# Patient Record
Sex: Male | Born: 1985 | Race: White | Hispanic: No | Marital: Married | Smoking: Current some day smoker
Health system: Southern US, Community
[De-identification: ages and names within clinical notes are randomized; demographics above are authoritative.]

## PROBLEM LIST (undated history)

## (undated) DIAGNOSIS — I10 Essential (primary) hypertension: Secondary | ICD-10-CM

## (undated) NOTE — ED Provider Notes (Signed)
 Formatting of this note is different from the original. eMERGENCY dEPARTMENT eNCOUnter    CHIEF COMPLAINT   Chief Complaint  Patient presents with  ? Foreign Body in Throat    pt ate fish and chips around 930pm, pt awoke from sleep c/o something being stuck in throat. pt says he feels it when he swallows, makes him vomit, pt maintains airway and secretions   HPI   Leroy Hill is a 51 y.o. male who presents with complaints of feeling like something is in his throat.  Patient states they were drinking alcohol this afternoon until about 6:00 p.m..  For dinner he had fish and chips.  No difficulties during dinner.  He did not feel any foreign body sensation or difficulty swallowing.  He woke up around 2:30 a.m. And was nauseated.  He started sweating and vomited.  After vomiting he felt like something was stuck around his sternal notch.  He states the feeling has been constant, and causes gagging.  He has dry heaves several times.  He still feels like there is something about midway down.  He states he can't really describe the sensation, just uncomfortable  PAST MEDICAL HISTORY   History reviewed. No pertinent past medical history.  Negative for GERD  SURGICAL HISTORY   History reviewed. No pertinent surgical history.  No previous EGD  CURRENT MEDICATIONS   Current Facility-Administered Medications  Medication Dose Route Frequency Provider Last Rate Last Dose  ? dexamethasone sodium phosphate (DECADRON) injection 10 mg  10 mg Intravenous Once Ozell JONELLE Justin, DO      ? ondansetron  (ZOFRAN ) injection 4 mg  4 mg Intravenous Once Ozell JONELLE Justin, DO      ? pantoprazole (PROTONIX) injection 40 mg  40 mg Intravenous Once Ozell JONELLE Justin, DO      ? sodium chloride (NS BOLUS) 0.9 % IV bolus 1,000 mL  1,000 mL Intravenous Once Ozell JONELLE Justin, DO       No current outpatient medications on file.   ALLERGIES   No Known Allergies  FAMILY HISTORY   History reviewed. No pertinent family history.   Noncontributory  SOCIAL HISTORY   Social History   Socioeconomic History  ? Marital status: None    Spouse name: None  ? Number of children: None  ? Years of education: None  ? Highest education level: None  Occupational History  ? None  Social Needs  ? Financial resource strain: None  ? Food insecurity:    Worry: None    Inability: None  ? Transportation needs:    Medical: None    Non-medical: None  Tobacco Use  ? Smoking status: Never Smoker  ? Smokeless tobacco: Current User    Types: Chew  Substance and Sexual Activity  ? Alcohol use: None  ? Drug use: None  ? Sexual activity: None  Lifestyle  ? Physical activity:    Days per week: None    Minutes per session: None  ? Stress: None  Relationships  ? Social connections:    Talks on phone: None    Gets together: None    Attends religious service: None    Active member of club or organization: None    Attends meetings of clubs or organizations: None    Relationship status: None  ? Intimate partner violence:    Fear of current or ex partner: None    Emotionally abused: None    Physically abused: None    Forced sexual activity: None  Other  Topics Concern  ? None  Social History Narrative  ? None   REVIEW OF SYSTEMS   Constitutional:  Denies fever, chills,  Respiratory:  Denies  shortness of breath.  Or any difficulty breathing  Cardiovascular:  Denies chest pain, GI:  Denies abdominal pain,  Neurologic:  Denies headache,   Psychiatric:  Denies depression, suicidal ideation or homicidal ideation.   See HPI for further details. ROS otherwise negative.  PHYSICAL EXAM   VITAL SIGNS: BP 176/74   Pulse 80   Temp 97.6 F (36.4 C)   Resp 18   Ht 5' 6 (1.676 m)   Wt 230 lb (104 kg)   SpO2 100%   BMI 37.12 kg/m  Constitutional:  Appears uncomfortable Eyes:  PERRLA, EOMI,  conjunctiva normal, no discharge, icterus HENT: Atraumatic, Normocephalic, Normal external nose/ears. Mucus membranes moist and  pharynx shows an edematous and elongated uvula.  No visible foreign body Neck:Normal inspection, normal range of motion, supple, no stridor Respiratory:  Normal breath sounds, no respiratory distress, no wheezing, no chest tenderness.  Cardiovascular:  Normal heart rate, normal rhythm,  Abd: Soft and nontender. Negative for distention.  Extremities:  Intact distal pulses, no edema, no tenderness, no cyanosis, no clubbing. Good range of motion in all major joints. No tenderness to palpation or major deformities noted.  Skin:  Warm, dry, no erythema, no rash.  Neurologic:  Awake and alert, gross normal cognition No focal deficits noted,  CN II-XII  Intact as tested Psychiatric: normal affect RADIOLOGY   Reviewed by me. See official radiology interpretation.  PROCEDURES   X-ray Neck Soft Tissue  Result Date: 10/18/2017 SOFT TISSUE NECK RADIOGRAPHS: COMPARISON: No comparison. FINDINGS: Anatomic alignment. Airway widely patent. Epiglottis normal. No foreign body.   IMPRESSION: NORMAL SOFT TISSUE NECK RADIOGRAPHS. Dictated By: Beverley JONETTA Specking MD 10/18/2017 6:09 AM  Electronically Signed by: Beverley JONETTA Specking MD 10/18/2017 6:14 AM X-ray Neck Soft Tissue  X-ray Neck Soft Tissue  Result Date: 10/18/2017 SOFT TISSUE NECK RADIOGRAPHS: COMPARISON: No comparison. FINDINGS: Anatomic alignment. Airway widely patent. Epiglottis normal. No foreign body.   IMPRESSION: NORMAL SOFT TISSUE NECK RADIOGRAPHS. Dictated By: Beverley JONETTA Specking MD 10/18/2017 6:09 AM  Electronically Signed by: Beverley JONETTA Specking MD 10/18/2017 6:14 AM  X-ray Chest Pa Or Ap  Result Date: 10/18/2017 SINGLE VIEW CHEST: COMPARISON: None. FINDINGS:    Mediastinum, heart size and pulmonary vascularity are normal. The lungs are clear.  No infiltrates are present. No pneumothorax or pleural effusions are present.   IMPRESSION: NORMAL CHEST. Dictated By: Mauricia JINNY Blanch, MD 10/18/2017 7:49 AM  Electronically Signed by: Mauricia JINNY Blanch, MD 10/18/2017 7:50 AM  ED  COURSE & MEDICAL DECISION MAKING   Pertinent labs & imaging studies reviewed. (See chart for details) Patient evaluated for foreign body sensation after vomiting.  He had no foreign body sensation or difficulties when he ate dinner.  Symptoms only started after waking up in vomiting.  On exam he does have some swelling of the uvula.  Patient is not sure if this is what is making him feel gagging.  He has no stridor or respiratory distress.  Neck x-ray negative for radiopaque foreign body.  Patient was able to easily drink the GI cocktail.  I have offered to the patient to have GI see him to do an endoscopy.  Patient states it is feeling better and he does not want to do that.  His significant other works for [employer].  They are supposed to have left  to go back home a few hours ago  FINAL IMPRESSION    odonyphagia Esophagitis   Ozell JONELLE Justin, DO 10/18/17 0752   Ozell JONELLE Justin, DO 10/18/17 [phone]  Electronically signed by Ozell JONELLE Justin, DO at 10/18/2017  7:54 AM EDT

---

## 2004-11-07 ENCOUNTER — Emergency Department: Payer: Self-pay | Admitting: Internal Medicine

## 2004-12-12 ENCOUNTER — Emergency Department: Payer: Self-pay | Admitting: Unknown Physician Specialty

## 2005-03-16 ENCOUNTER — Emergency Department: Payer: Self-pay | Admitting: Unknown Physician Specialty

## 2005-10-25 ENCOUNTER — Emergency Department: Payer: Self-pay | Admitting: Emergency Medicine

## 2010-10-01 DIAGNOSIS — F17200 Nicotine dependence, unspecified, uncomplicated: Secondary | ICD-10-CM | POA: Insufficient documentation

## 2010-10-01 DIAGNOSIS — I10 Essential (primary) hypertension: Secondary | ICD-10-CM | POA: Insufficient documentation

## 2012-09-13 ENCOUNTER — Emergency Department: Payer: Self-pay | Admitting: Emergency Medicine

## 2015-09-03 ENCOUNTER — Encounter: Payer: Self-pay | Admitting: Emergency Medicine

## 2015-09-03 ENCOUNTER — Ambulatory Visit
Admission: EM | Admit: 2015-09-03 | Discharge: 2015-09-03 | Disposition: A | Discharge status: Home / Self Care | Payer: 59 | Attending: Emergency Medicine | Admitting: Emergency Medicine

## 2015-09-03 ENCOUNTER — Ambulatory Visit (INDEPENDENT_AMBULATORY_CARE_PROVIDER_SITE_OTHER): Payer: 59

## 2015-09-03 DIAGNOSIS — J189 Pneumonia, unspecified organism: Secondary | ICD-10-CM

## 2015-09-03 DIAGNOSIS — J111 Influenza due to unidentified influenza virus with other respiratory manifestations: Secondary | ICD-10-CM | POA: Diagnosis not present

## 2015-09-03 HISTORY — DX: Essential (primary) hypertension: I10

## 2015-09-03 MED ORDER — PREDNISONE 50 MG PO TABS: 50.0000 mg | ORAL_TABLET | Freq: Every day | ORAL | Status: DC

## 2015-09-03 MED ORDER — IPRATROPIUM-ALBUTEROL 0.5-2.5 (3) MG/3ML IN SOLN
3.0000 mL | Freq: Once | RESPIRATORY_TRACT | Status: AC
  Administered 2015-09-03: 3 mL via RESPIRATORY_TRACT

## 2015-09-03 MED ORDER — ALBUTEROL SULFATE HFA 108 (90 BASE) MCG/ACT IN AERS: 1.0000 | INHALATION_SPRAY | Freq: Four times a day (QID) | RESPIRATORY_TRACT | Status: DC | PRN

## 2015-09-03 MED ORDER — AEROCHAMBER PLUS MISC: Status: DC
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
## 2015-09-03 NOTE — ED Notes (Signed)
Patient states he developed a cough yesterday, today he states he feels much worse, trouble breathing

## 2015-09-03 NOTE — Discharge Instructions (Signed)
Albuterol inhaler 2 puffs every 4-6 hours as needed for chest tightness, wheezing, cough. 1 g of Tylenol with 800 mg of ibuprofen 3 times a day for body aches and fever. Start the Tamiflu. Drink plenty of fluids. Go to the ER for the signs and symptoms we discussed

## 2015-11-05 ENCOUNTER — Emergency Department
Admission: EM | Admit: 2015-11-05 | Discharge: 2015-11-05 | Disposition: A | Discharge status: Home / Self Care | Payer: 59 | Attending: Emergency Medicine | Admitting: Emergency Medicine

## 2015-11-05 DIAGNOSIS — R197 Diarrhea, unspecified: Secondary | ICD-10-CM | POA: Insufficient documentation

## 2015-11-05 DIAGNOSIS — F17200 Nicotine dependence, unspecified, uncomplicated: Secondary | ICD-10-CM | POA: Diagnosis not present

## 2015-11-05 DIAGNOSIS — R1013 Epigastric pain: Secondary | ICD-10-CM | POA: Diagnosis present

## 2015-11-05 DIAGNOSIS — I10 Essential (primary) hypertension: Secondary | ICD-10-CM | POA: Diagnosis not present

## 2015-11-05 DIAGNOSIS — R112 Nausea with vomiting, unspecified: Secondary | ICD-10-CM | POA: Diagnosis not present

## 2015-11-05 MED ORDER — DICYCLOMINE HCL 20 MG PO TABS: 20.0000 mg | ORAL_TABLET | Freq: Three times a day (TID) | ORAL | Status: DC | PRN

## 2015-11-05 MED ORDER — DICYCLOMINE HCL 10 MG PO CAPS: 10.0000 mg | ORAL_CAPSULE | Freq: Once | ORAL | Status: DC

## 2015-11-05 MED ORDER — DICYCLOMINE HCL 10 MG PO CAPS: 20.0000 mg | ORAL_CAPSULE | Freq: Once | ORAL | Status: DC

## 2015-11-05 MED ORDER — DICYCLOMINE HCL 20 MG PO TABS
ORAL_TABLET | ORAL | Status: AC
  Administered 2015-11-05: 20 mg
  Filled 2015-11-05: qty 1

## 2015-11-05 MED ORDER — ONDANSETRON HCL 4 MG/2ML IJ SOLN
INTRAMUSCULAR | Status: AC
  Administered 2015-11-05: 4 mg
  Filled 2015-11-05: qty 2

## 2015-11-05 MED ORDER — ONDANSETRON HCL 4 MG PO TABS: 4.0000 mg | ORAL_TABLET | Freq: Every day | ORAL | Status: DC | PRN

## 2015-11-05 NOTE — ED Notes (Signed)
After eating at local ice cream shop today began to vomiting and have diarrhea with severe abd cramping

## 2015-11-05 NOTE — ED Provider Notes (Signed)
[facility] Emergency Department Provider Note   ____________________________________________  Time seen: Seen upon arrival to the emergency department  I have reviewed the triage vital signs and the nursing notes.   HISTORY  Chief Complaint Emesis   HPI Leroy Hill is a 30 y.o. male with a history of hypertension who is presenting to the emergency department today with nausea vomiting and diarrhea. He ate ice cream at [employer] and grandmother at about 3:30 or 4 PM this afternoon. He then said that about an hour after started having a strange feeling in his stomach. 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 He says that the vomit was very foul-smelling. Denies any blood in the vomit or diarrhea. Had an IV started en route as well as Zofran 4 mg given. Patient says he has upper abdominal cramping at this time. 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 No known sick contacts. Cramping is nonradiating and moderate at this time.   Past Medical History  Diagnosis Date  . Hypertension     There are no active problems to display for this patient.   History reviewed. No pertinent past surgical history.  Current Outpatient Rx  Name  Route  Sig  Dispense  Refill  . albuterol (PROVENTIL HFA;VENTOLIN HFA) 108 (90 Base) MCG/ACT inhaler   Inhalation   Inhale 1-2 puffs into the lungs every 6 (six) hours as needed for wheezing or shortness of breath.   1 Inhaler   0   . benazepril-hydrochlorthiazide (LOTENSIN HCT) 10-12.5 MG tablet   Oral   Take 1 tablet by mouth daily.         . predniSONE (DELTASONE) 50 MG tablet   Oral   Take 1 tablet (50 mg total) by mouth daily with breakfast.   5 tablet   0   . Spacer/Aero-Holding Chambers (AEROCHAMBER PLUS)  inhaler      Use as instructed   1 each   2     Allergies Review of patient's allergies indicates no known allergies.  No family history on file.  Social History Social History  Substance Use Topics  . Smoking status: Current Some Day Smoker  . Smokeless tobacco: Current User  . Alcohol Use: Yes    Review of Systems Constitutional: No fever/chills Eyes: No visual changes. ENT: No sore throat. Cardiovascular: Denies chest pain. Respiratory: Denies shortness of breath. Gastrointestinal: no constipation. Genitourinary: Negative for dysuria. Musculoskeletal: Negative for back pain. Skin: Negative for rash. Neurological: Negative for headaches, focal weakness or numbness.  10-point ROS otherwise negative.  ____________________________________________   PHYSICAL EXAM:  VITAL SIGNS: ED Triage Vitals  Enc Vitals Group     BP --      Pulse --      Resp --      Temp 11/05/15 2005 98.3 F (36.8 C)     Temp Source 11/05/15 2005 Oral     SpO2 --      Weight 11/05/15 1944 220 lb (99.791 kg)     Height 11/05/15 1944  (1.676 m)     Head Cir --      Peak Flow --      Pain Score 11/05/15 1944 8     Pain  Loc --      Pain Edu? --      Excl. in GC? --     Constitutional: Alert and oriented. Well appearing and in no acute distress. Eyes: Conjunctivae are normal. PERRL. EOMI. Head: Atraumatic. Nose: No congestion/rhinnorhea. Mouth/Throat: Mucous membranes are moist.   Neck: No stridor.   Cardiovascular: Normal rate, regular rhythm. Grossly normal heart sounds.   Respiratory: Normal respiratory effort.  No retractions. Lungs CTAB. Gastrointestinal: Soft With mild epigastric tenderness palpation. No rebound or guarding. No distention.  Musculoskeletal: No lower extremity tenderness nor edema.  No joint effusions. Neurologic:  Normal speech and language. No gross focal neurologic deficits are appreciated. No gait instability. Skin:  Skin is warm, dry and intact. No  rash noted. Psychiatric: Mood and affect are normal. Speech and behavior are normal.  ____________________________________________   LABS (all labs ordered are listed, but only abnormal results are displayed)  Labs Reviewed - No data to display ____________________________________________  EKG  ____________________________________________  RADIOLOGY   ____________________________________________   PROCEDURES   ____________________________________________   INITIAL IMPRESSION / ASSESSMENT AND PLAN / ED COURSE  Pertinent labs & imaging results that were available during my care of the patient were reviewed by me and considered in my medical decision making (see chart for details).  ----------------------------------------- 8:35 PM on 11/05/2015 -----------------------------------------  Patient without any distress at this time. Says that he still is having epigastric cramping but no tenderness when repalpated the epigastrium. He has had no episode of diarrhea but is been able to tolerate Bentyl by mouth. The synchronized timing of the onset of illness with his partner was for a similar symptoms but suspect that this is either food poisoning or viral illness. We discussed the lab workup but we both agree that the more reasonable course would be to rest at home and to see if the symptoms continue to resolve over time. We discussed return precautions such as worsening abdominal pain or worsening nausea or vomiting. The patient understands the plan and is willing to comply. I offered Zofran and Bentyl but he refused. Less likely to be surgical abdominal pathology at this time. ____________________________________________   FINAL CLINICAL IMPRESSION(S) / ED DIAGNOSES  Abdominal pain with nausea vomiting and diarrhea.    NEW MEDICATIONS STARTED DURING THIS VISIT:  New Prescriptions   No medications on file     Note:  This document was prepared using Dragon voice  recognition software and may include unintentional dictation errors.    Myrna Blazeravid Matthew Emmanuell Kantz, MD 11/05/15 2037

## 2015-11-05 NOTE — Discharge Instructions (Signed)
Abdominal Pain, Adult Many things can cause belly (abdominal) pain. Most times, the belly pain is not dangerous. Many cases of belly pain can be watched and treated at home. HOME CARE   Do not take medicines that help you go poop (laxatives) unless told to by your doctor.  Only take medicine as told by your doctor.  Eat or drink as told by your doctor. Your doctor will tell you if you should be on a special diet. GET HELP IF:  You do not know what is causing your belly pain.  You have belly pain while you are sick to your stomach (nauseous) or have runny poop (diarrhea).  You have pain while you pee or poop.  Your belly pain wakes you up at night.  You have belly pain that gets worse or better when you eat.  You have belly pain that gets worse when you eat fatty foods.  You have a fever. GET HELP RIGHT AWAY IF:   The pain does not go away within 2 hours.  You keep throwing up (vomiting).  The pain changes and is only in the right or left part of the belly.  You have bloody or tarry looking poop. MAKE SURE YOU:   Understand these instructions.  Will watch your condition.  Will get help right away if you are not doing well or get worse.   This information is not intended to replace advice given to you by your health care provider. Make sure you discuss any questions you have with your health care provider.   Document Released: 11/12/2007 Document Revised: 06/16/2014 Document Reviewed: 02/02/2013 Elsevier Interactive Patient Education 2016 [place].  Diarrhea Diarrhea is frequent loose and watery bowel movements. It can cause you to feel weak and dehydrated. Dehydration can cause you to become tired and thirsty, have a dry mouth, and have decreased urination that often is dark yellow. Diarrhea is a sign of another problem, most often an infection that will not last long. In most cases, diarrhea typically lasts 2-3 days. However, it can last longer if it is a sign of  something more serious. It is important to treat your diarrhea as directed by your caregiver to lessen or prevent future episodes of diarrhea. CAUSES  Some common causes include:  Gastrointestinal infections caused by viruses, bacteria, or parasites.  Food poisoning or food allergies.  Certain medicines, such as antibiotics, chemotherapy, and laxatives.  Artificial sweeteners and fructose.  Digestive disorders. HOME CARE INSTRUCTIONS  Ensure adequate fluid intake (hydration): Have 1 cup (8 oz) of fluid for each diarrhea episode. Avoid fluids that contain simple sugars or sports drinks, fruit juices, whole milk products, and sodas. Your urine should be clear or pale yellow if you are drinking enough fluids. Hydrate with an oral rehydration solution that you can purchase at pharmacies, retail stores, and online. You can prepare an oral rehydration solution at home by mixing the following ingredients together:   - tsp table salt.   tsp baking soda.   tsp salt substitute containing potassium chloride.  1  tablespoons sugar.  1 L (34 oz) of water.  Certain foods and beverages may increase the speed at which food moves through the gastrointestinal (GI) tract. These foods and beverages should be avoided and include:  Caffeinated and alcoholic beverages.  High-fiber foods, such as raw fruits and vegetables, nuts, seeds, and whole grain breads and cereals.  Foods and beverages sweetened with sugar alcohols, such as xylitol, sorbitol, and mannitol.  Some  foods may be well tolerated and may help thicken stool including:  Starchy foods, such as rice, toast, pasta, low-sugar cereal, oatmeal, grits, baked potatoes, crackers, and bagels.  Bananas.  Applesauce.  Add probiotic-rich foods to help increase healthy bacteria in the GI tract, such as yogurt and fermented milk products.  Wash your hands well after each diarrhea episode.  Only take over-the-counter or prescription medicines  as directed by your caregiver.  Take a warm bath to relieve any burning or pain from frequent diarrhea episodes. SEEK IMMEDIATE MEDICAL CARE IF:   You are unable to keep fluids down.  You have persistent vomiting.  You have blood in your stool, or your stools are black and tarry.  You do not urinate in 6-8 hours, or there is only a small amount of very dark urine.  You have abdominal pain that increases or localizes.  You have weakness, dizziness, confusion, or light-headedness.  You have a severe headache.  Your diarrhea gets worse or does not get better.  You have a fever or persistent symptoms for more than 2-3 days.  You have a fever and your symptoms suddenly get worse. MAKE SURE YOU:   Understand these instructions.  Will watch your condition.  Will get help right away if you are not doing well or get worse.   This information is not intended to replace advice given to you by your health care provider. Make sure you discuss any questions you have with your health care provider.   Document Released: 05/16/2002 Document Revised: 06/16/2014 Document Reviewed: 02/01/2012 Elsevier Interactive Patient Education 2016 Elsevier Inc[employer]  Nausea and Vomiting Nausea is a sick feeling that often comes before throwing up (vomiting). Vomiting is a reflex where stomach contents come out of your mouth. Vomiting can cause severe loss of body fluids (dehydration). Children and elderly adults can become dehydrated quickly, especially if they also have diarrhea. Nausea and vomiting are symptoms of a condition or disease. It is important to find the cause of your symptoms. CAUSES   Direct irritation of the stomach lining. This irritation can result from increased acid production (gastroesophageal reflux disease), infection, food poisoning, taking certain medicines (such as nonsteroidal anti-inflammatory drugs), alcohol use, or tobacco use.  Signals from the brain.These signals could be  caused by a headache, heat exposure, an inner ear disturbance, increased pressure in the brain from injury, infection, a tumor, or a concussion, pain, emotional stimulus, or metabolic problems.  An obstruction in the gastrointestinal tract (bowel obstruction).  Illnesses such as diabetes, hepatitis, gallbladder problems, appendicitis, kidney problems, cancer, sepsis, atypical symptoms of a heart attack, or eating disorders.  Medical treatments such as chemotherapy and radiation.  Receiving medicine that makes you sleep (general anesthetic) during surgery. DIAGNOSIS Your caregiver may ask for tests to be done if the problems do not improve after a few days. Tests may also be done if symptoms are severe or if the reason for the nausea and vomiting is not clear. Tests may include:  Urine tests.  Blood tests.  Stool tests.  Cultures (to look for evidence of infection).  X-rays or other imaging studies. Test results can help your caregiver make decisions about treatment or the need for additional tests. TREATMENT You need to stay well hydrated. Drink frequently but in small amounts.You may wish to drink water, sports drinks, clear broth, or eat frozen ice pops or gelatin dessert to help stay hydrated.When you eat, eating slowly may help prevent nausea.There are also some antinausea medicines  that may help prevent nausea. °HOME CARE INSTRUCTIONS  °· Take all medicine as directed by your caregiver. °· If you do not have an appetite, do not force yourself to eat. However, you must continue to drink fluids. °· If you have an appetite, eat a normal diet unless your caregiver tells you differently. °¨ Eat a variety of complex carbohydrates (rice, wheat, potatoes, bread), lean meats, yogurt, fruits, and vegetables. °¨ Avoid high-fat foods because they are more difficult to digest. °· Drink enough water and fluids to keep your urine clear or pale yellow. °· If you are dehydrated, ask your caregiver for  specific rehydration instructions. Signs of dehydration may include: °¨ Severe thirst. °¨ Dry lips and mouth. °¨ Dizziness. °¨ Dark urine. °¨ Decreasing urine frequency and amount. °¨ Confusion. °¨ Rapid breathing or pulse. °SEEK IMMEDIATE MEDICAL CARE IF:  °· You have blood or brown flecks (like coffee grounds) in your vomit. °· You have black or bloody stools. °· You have a severe headache or stiff neck. °· You are confused. °· You have severe abdominal pain. °· You have chest pain or trouble breathing. °· You do not urinate at least once every 8 hours. °· You develop cold or clammy skin. °· You continue to vomit for longer than 24 to 48 hours. °· You have a fever. °MAKE SURE YOU:  °· Understand these instructions. °· Will watch your condition. °· Will get help right away if you are not doing well or get worse. °  °This information is not intended to replace advice given to you by your health care provider. Make sure you discuss any questions you have with your health care provider. °  °Document Released: 05/26/2005 Document Revised: 08/18/2011 Document Reviewed: 10/23/2010 °Elsevier Interactive Patient Education ©2016 Elsevier Inc. ° °

## 2015-11-05 NOTE — ED Notes (Signed)
Sudden onset of nausea/vomiting/diarreha with abd cramping after eating at a local ice cream shop

## 2017-07-08 ENCOUNTER — Ambulatory Visit (INDEPENDENT_AMBULATORY_CARE_PROVIDER_SITE_OTHER): Payer: Commercial Managed Care - PPO

## 2017-07-08 ENCOUNTER — Ambulatory Visit
Admission: EM | Admit: 2017-07-08 | Discharge: 2017-07-08 | Disposition: A | Discharge status: Home / Self Care | Payer: Commercial Managed Care - PPO | Attending: Family Medicine | Admitting: Family Medicine

## 2017-07-08 ENCOUNTER — Encounter: Payer: Self-pay | Admitting: Emergency Medicine

## 2017-07-08 ENCOUNTER — Other Ambulatory Visit: Payer: Self-pay

## 2017-07-08 DIAGNOSIS — W19XXXA Unspecified fall, initial encounter: Secondary | ICD-10-CM

## 2017-07-08 DIAGNOSIS — M79672 Pain in left foot: Secondary | ICD-10-CM | POA: Diagnosis not present

## 2017-07-08 NOTE — ED Triage Notes (Signed)
Patient states that he fell today and developed pain on the top and bottom of his left foot.

## 2017-07-08 NOTE — ED Notes (Signed)
Walking boot placed left foot

## 2017-07-08 NOTE — Discharge Instructions (Signed)
Ice. Elevate. Rest. Drink plenty of fluids. Gradually increase weight as tolerated.   Follow up with your orthopedic or podiatry as needed for continued pain.   Follow up with your primary care physician this week as needed. Return to Urgent care for new or worsening concerns.

## 2017-07-08 NOTE — ED Provider Notes (Signed)
[facility] ____________________________________________  Time seen: Approximately 758 PM  I have reviewed the triage vital signs and the nursing notes.   HISTORY  Chief Complaint Foot Pain and Fall   HPI Leroy Hill is a 32 y.o. male presenting with wife at bedside for evaluation of left foot pain post injury that occurred approximately 2 hours prior to arrival.  Reports they were at their [employer], and his nephew went running outside down the steps and towards the road.  States that he jumped up from the table to run after him.  States that he tripped over the base of the door frame causing him to fall.  States that he fell catching himself to his hands and somehow injured his left foot, but reports quickly jumped up and went to proceed to get his nephew.  States he did not feel the pain until he returned to the table and sat down.  States he has not since been able to tolerate any weight-bear on his left foot.  Denies any pain radiation, paresthesias or other injury.  No head injury or loss conscious.  Reports other than left that he feels fine.  Denies history of same to the same area.  To take 800 mg of ibuprofen prior to arrival without change at this point.  States pain is primarily with weightbearing and some with direct pressure.  Denies other alleviating measures.  Reports otherwise feels well.Denies recent sickness. Denies recent antibiotic use.  States did stop by and borrow friend's crutches that he can continue to use as needed.  Reports follows with orthopedic at [facility].   Past Medical History:  Diagnosis Date  . Hypertension     There are no active problems to display for this patient.   History reviewed. No pertinent surgical history.   No current facility-administered medications for this encounter.  No current outpatient medications on file.  Allergies Patient has no known allergies.  History reviewed. No pertinent family  history.  Social History Social History   Tobacco Use  . Smoking status: Current Some Day Smoker  . Smokeless tobacco: Current User  Substance Use Topics  . Alcohol use: Yes  . Drug use: Not on file    Review of Systems Constitutional: No fever/chills Cardiovascular: Denies chest pain. Respiratory: Denies shortness of breath. Gastrointestinal: No abdominal pain.  Genitourinary: Negative for dysuria. Musculoskeletal: Negative for back pain.  As above Skin: Negative for rash.   ____________________________________________   PHYSICAL EXAM:  VITAL SIGNS: ED Triage Vitals  Enc Vitals Group     BP 07/08/17 1953 (!) 153/79     Pulse Rate 07/08/17 1953 74     Resp 07/08/17 1953 16     Temp 07/08/17 1953 98.3 F (36.8 C)     Temp Source 07/08/17 1953 Oral     SpO2 07/08/17 1953 99 %     Weight 07/08/17 1951 235 lb (106.6 kg)     Height 07/08/17 1951 5\' 6"  (1.676 m)     Head Circumference --      Peak Flow --      Pain Score 07/08/17 1951 2     Pain Loc --      Pain Edu? --      Excl. in GC? --     Constitutional: Alert and oriented. Well appearing and in no acute distress. Cardiovascular: Normal rate, regular rhythm. Grossly normal heart sounds.  Good peripheral circulation. Respiratory: Normal respiratory effort without tachypnea nor retractions. Breath sounds  are clear and equal bilaterally. No wheezes, rales, rhonchi. Musculoskeletal:  No midline cervical, thoracic or lumbar tenderness to palpation. Bilateral pedal pulses equal and easily palpated.   Except: Left medial foot dorsal foot at proximal base of first metatarsal mild to moderate tenderness to palpation as well as mild tenderness to same area plantar aspect, minimal swelling, no ecchymosis, normal distal sensation and capillary refill, mild pain to same area with dorsiflexion, no pain with plantarflexion, ankle and left lower extremity otherwise nontender.  Patient unable to weight-bear in room. Gait not  tested.  Neurologic:  Normal speech and language. No gross focal neurologic deficits are appreciated. Speech is normal.   Skin:  Skin is warm, dry and intact. No rash noted. Psychiatric: Mood and affect are normal. Speech and behavior are normal. Patient exhibits appropriate insight and judgment   ___________________________________________   LABS (all labs ordered are listed, but only abnormal results are displayed)  Labs Reviewed - No data to display  RADIOLOGY  Dg Foot Complete Left  Result Date: 07/08/2017 CLINICAL DATA:  Medial left foot pain dorsally after falling this evening. EXAM: LEFT FOOT - COMPLETE 3+ VIEW COMPARISON:  None. FINDINGS: There is no evidence of fracture or dislocation. There is no evidence of arthropathy or other focal bone abnormality. Soft tissues are unremarkable. IMPRESSION: Normal examination. Electronically Signed   By: Beckie SaltsSteven  Reid M.D.   On: 07/08/2017 20:35   ____________________________________________   PROCEDURES Procedures    INITIAL IMPRESSION / ASSESSMENT AND PLAN / ED COURSE  Pertinent labs & imaging results that were available during my care of the patient were reviewed by me and considered in my medical decision making (see chart for details).  Very well-appearing patient.  No acute distress.  Left foot pain post mechanical injury that occurred just prior to arrival.  Left foot x-ray per radiologist as above, normal examination.  Suspect contusion and sprain injuries.  Continue with crutches use as long as needed.  Left foot boot given to use as gradual increase of activity.  Over-the-counter ibuprofen.  Declines any prescription.  Encouraged podiatry and orthopedic follow-up for continued pain. Ice, elevation and supportive care.   Discussed follow up with Primary care physician this week. Discussed follow up and return parameters including no resolution or any worsening concerns. Patient verbalized understanding and agreed to plan.    ____________________________________________   FINAL CLINICAL IMPRESSION(S) / ED DIAGNOSES  Final diagnoses:  Left foot pain     ED Discharge Orders    None       Note: This dictation was prepared with Dragon dictation along with smaller phrase technology. Any transcriptional errors that result from this process are unintentional.         Renford DillsMiller, Casyn Becvar, NP 07/08/17 2110

## 2017-07-11 ENCOUNTER — Telehealth: Payer: Self-pay

## 2017-07-11 NOTE — Telephone Encounter (Signed)
Called to follow up with patient since visit here at [facility]. Patient instructed to call back with any questions or concerns. MAH  

## 2021-06-28 ENCOUNTER — Encounter: Payer: Self-pay | Admitting: Emergency Medicine

## 2021-06-28 ENCOUNTER — Emergency Department: Payer: Worker's Compensation

## 2021-06-28 ENCOUNTER — Other Ambulatory Visit: Payer: Self-pay

## 2021-06-28 DIAGNOSIS — T754XXA Electrocution, initial encounter: Secondary | ICD-10-CM | POA: Insufficient documentation

## 2021-06-28 DIAGNOSIS — I10 Essential (primary) hypertension: Secondary | ICD-10-CM | POA: Insufficient documentation

## 2021-06-28 DIAGNOSIS — W868XXA Exposure to other electric current, initial encounter: Secondary | ICD-10-CM | POA: Diagnosis not present

## 2021-06-28 DIAGNOSIS — Y990 Civilian activity done for income or pay: Secondary | ICD-10-CM | POA: Diagnosis not present

## 2021-06-28 LAB — BASIC METABOLIC PANEL
Anion gap: 7 (ref 5–15)
BUN: 14 mg/dL (ref 6–20)
CO2: 26 mmol/L (ref 22–32)
Calcium: 8.8 mg/dL — ABNORMAL LOW (ref 8.9–10.3)
Chloride: 104 mmol/L (ref 98–111)
Creatinine, Ser: 1 mg/dL (ref 0.61–1.24)
GFR, Estimated: >60 mL/min (ref >60)
Glucose, Bld: 100 mg/dL — ABNORMAL HIGH (ref 70–99)
Potassium: 3.9 mmol/L (ref 3.5–5.1)
Sodium: 137 mmol/L (ref 135–145)

## 2021-06-28 LAB — CBC
HCT: 38.1 % — ABNORMAL LOW (ref 39.0–52.0)
Hemoglobin: 12.9 g/dL — ABNORMAL LOW (ref 13.0–17.0)
MCH: 30.2 pg (ref 26.0–34.0)
MCHC: 33.9 g/dL (ref 30.0–36.0)
MCV: 89.2 fL (ref 80.0–100.0)
Platelets: 292 10*3/uL (ref 150–400)
RBC: 4.27 MIL/uL (ref 4.22–5.81)
RDW: 11.6 % (ref 11.5–15.5)
WBC: 5.4 10*3/uL (ref 4.0–10.5)
nRBC: 0 % (ref 0.0–0.2)

## 2021-06-28 LAB — CK: Total CK: 96 U/L (ref 49–397)
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
## 2021-06-28 NOTE — ED Notes (Signed)
Workers comp. [place].  

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
## 2021-06-29 ENCOUNTER — Emergency Department
Admission: EM | Admit: 2021-06-29 | Discharge: 2021-06-29 | Disposition: A | Discharge status: Home / Self Care | Payer: Worker's Compensation | Attending: Emergency Medicine | Admitting: Emergency Medicine

## 2021-06-29 DIAGNOSIS — T754XXA Electrocution, initial encounter: Secondary | ICD-10-CM

## 2021-06-29 LAB — CK: Total CK: 77 U/L (ref 49–397)

## 2021-06-29 LAB — TROPONIN I (HIGH SENSITIVITY): Troponin I (High Sensitivity): 3 ng/L (ref <18)
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
## 2021-07-30 ENCOUNTER — Emergency Department: Payer: Worker's Compensation

## 2021-07-30 ENCOUNTER — Emergency Department
Admission: EM | Admit: 2021-07-30 | Discharge: 2021-07-30 | Disposition: A | Discharge status: Home / Self Care | Payer: Worker's Compensation | Attending: Emergency Medicine | Admitting: Emergency Medicine

## 2021-07-30 ENCOUNTER — Other Ambulatory Visit: Payer: Self-pay

## 2021-07-30 DIAGNOSIS — W228XXA Striking against or struck by other objects, initial encounter: Secondary | ICD-10-CM | POA: Diagnosis not present

## 2021-07-30 DIAGNOSIS — N5082 Scrotal pain: Secondary | ICD-10-CM | POA: Insufficient documentation

## 2021-07-30 DIAGNOSIS — S3994XA Unspecified injury of external genitals, initial encounter: Secondary | ICD-10-CM

## 2021-07-30 NOTE — ED Provider Notes (Signed)
[facility] Provider Note    None    (approximate)   History   Testicle Pain   HPI  Leroy Hill is a 36 y.o. male who comes in with scrotal pain.  Patient states a rock shot out from under a forklift and hit his right testicle around 130 or 230.  He reports some instantaneously swelling but the swelling has now come down.  He does not really have any significant pain at this time but has severe pain when it first happened.  Went to a clinic with who felt they felt an abnormality behind his right testicle so was sent here for further evaluation.  Physical Exam   Triage Vital Signs: ED Triage Vitals  Enc Vitals Group     BP 07/30/21 1635 (!) 186/101     Pulse Rate 07/30/21 1635 65     Resp 07/30/21 1635 18     Temp 07/30/21 1635 98 F (36.7 C)     Temp src --      SpO2 07/30/21 1635 97 %     Weight --      Height --      Head Circumference --      Peak Flow --      Pain Score 07/30/21 1634 8     Pain Loc --      Pain Edu? --      Excl. in [place]? --     Most recent vital signs: Vitals:   07/30/21 1635  BP: (!) 186/101  Pulse: 65  Resp: 18  Temp: 98 F (36.7 C)  SpO2: 97%     General: Awake, no distress.  CV:  Good peripheral perfusion.  Resp:  Normal effort.  Abd:  No distention.  Other:  GU exam performed with nurse.  Patient has no swelling or ecchymosis noted to the testicles.  There are no open abrasions.  He is got a little bit of tenderness on the right testicle without any significant changes   ED Results / Procedures / Treatments   RADIOLOGY I have reviewed the  [place] no hematoma     IMPRESSION / MDM / ASSESSMENT AND PLAN / ED COURSE  I reviewed the triage vital signs and the nursing notes.                              Differential diagnosis includes, but is not limited to, scrotal rupture, scrotal abrasion.  Denies any symptoms prior to this accident to suggest issues with UTI, STDs ultrasound ordered from triage  with a cyst in the right hemiscrotum maybe that is what the other provider was feeling as well as some concern for some possible blunting of the arterial upstroke.  I did discuss this case with Dr. Bernardo Heater given this abnormal finding   Dr. Bernardo Heater reviewed it and felt that this is unlikely to represent anything emergent and can follow-up outpatient with urology as needed.  Discussed with the patient and feels comfortable with discharge home using Tylenol, ibuprofen for pain  Patient was hypertensive most likely secondary to pain and recommended a recheck with PCP     FINAL CLINICAL IMPRESSION(S) / ED DIAGNOSES   Final diagnoses:  Scrotum pain     Rx / DC Orders   ED Discharge Orders     None        Note:  This document was prepared using Dragon voice recognition software  and may include unintentional dictation errors.   Vanessa [place], MD 07/30/21 [phone]

## 2021-07-30 NOTE — ED Triage Notes (Addendum)
Pt comes with c/o scrotum pain, pt states a rock shot out and hit him in the balls while on the clock. 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 Pt states severe pain and swelling and feels like something is hanging out.

## 2021-07-30 NOTE — Discharge Instructions (Addendum)
I reviewed the ultrasound with the urologist who is not concerned about the point #1 given there was some lighting up on both testicles point number 1 on the ultrasound but u can all them to get follow up if continue to have pain or any other concerns.    IMPRESSION: 1. Possible blunting of the arterial upstroke in the right testicle, but with similar appearance of the vascularity on color Doppler in the bilateral testicles, findings which are nonspecific and may be artifactual however intermittent/partial torsion can not be entirely excluded. Recommend close interval clinical follow-up and possible repeat ultrasound with Doppler as clinically indicated. 2. Normal grayscale appearance of the bilateral testicles. 3. Anechoic 3.5 mm subcutaneous cyst in the right hemiscrotum, likely a sebaceous cyst or other benign finding.

## 2022-02-03 DIAGNOSIS — E782 Mixed hyperlipidemia: Secondary | ICD-10-CM | POA: Insufficient documentation

## 2023-06-08 IMAGING — US US SCROTUM W/ DOPPLER COMPLETE
1 series · 13 of 25 positions shown · non-contrast
Comparison: None.

CLINICAL DATA: Scrotal pain, injury to right side.

EXAM:
SCROTAL ULTRASOUND
DOPPLER ULTRASOUND OF THE TESTICLES
TECHNIQUE: Complete ultrasound examination of the testicles, epididymis, and
other scrotal structures was performed. Color and spectral Doppler
ultrasound were also utilized to evaluate blood flow to the
testicles.

[Series 1: us scrotum w/doppler · 13 of 35 slices shown]
[im 1/35]
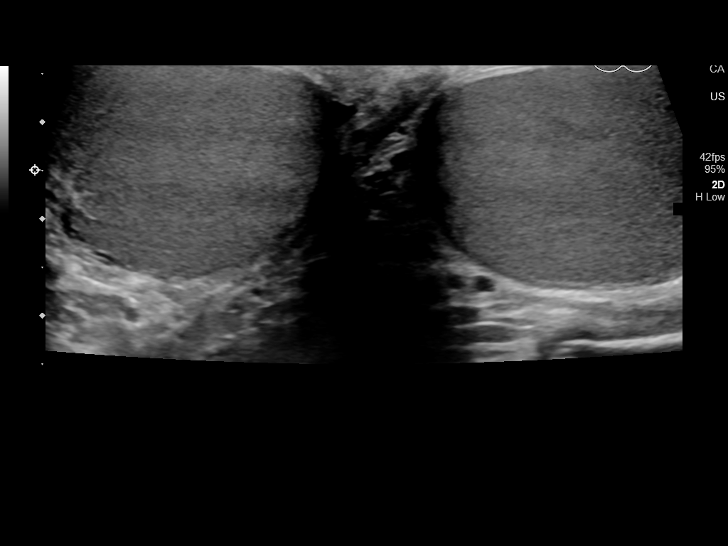
[im 3/35]
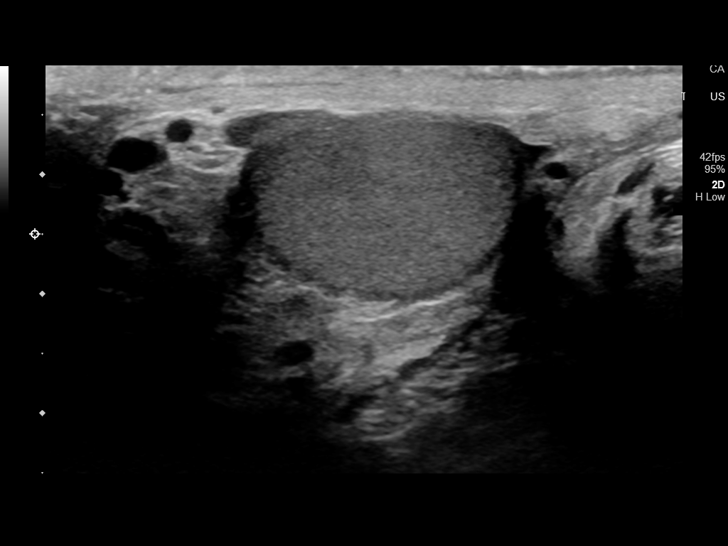
[im 6/35]
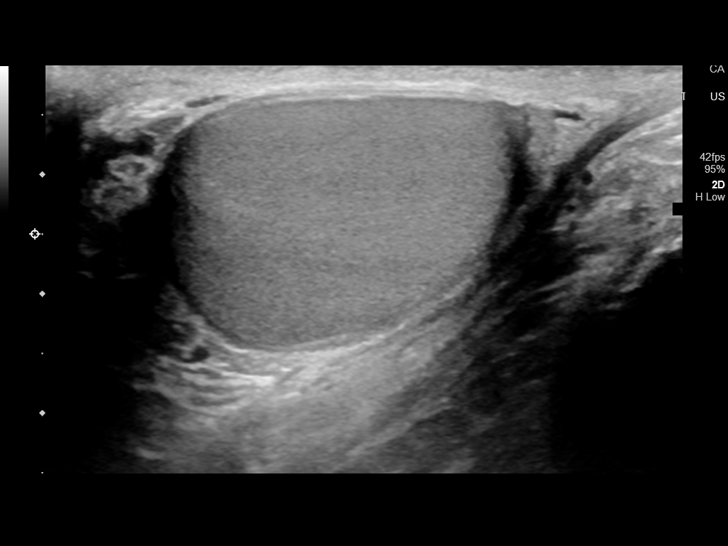
[im 9/35]
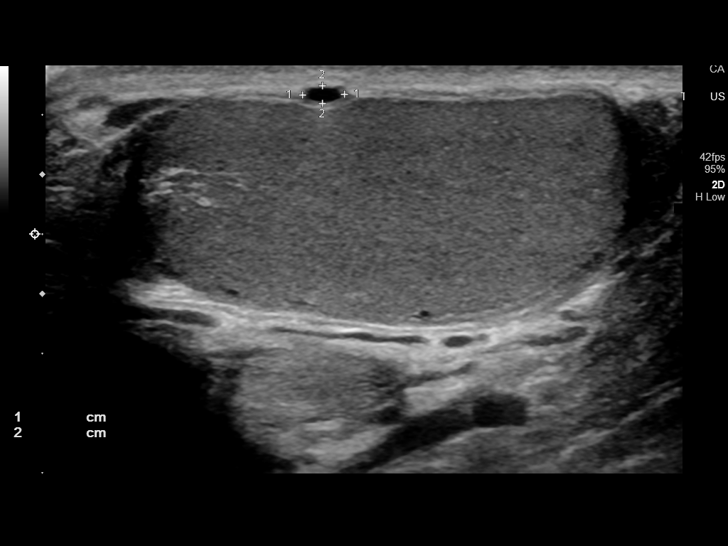
[im 12/35]
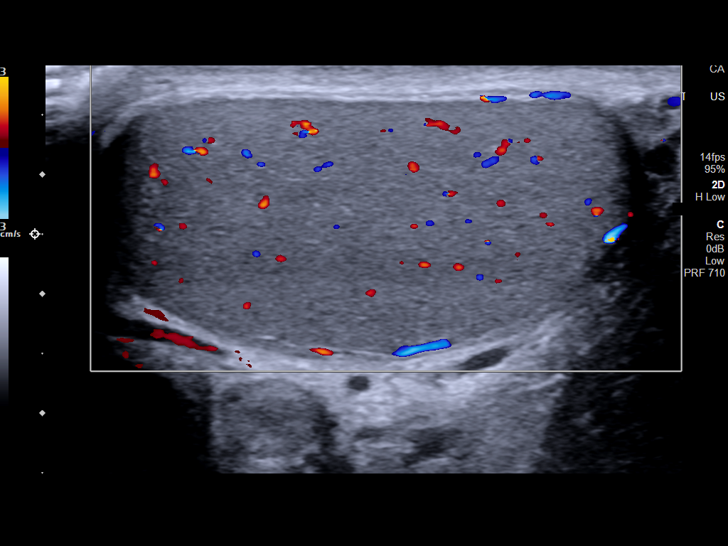
[im 15/35]
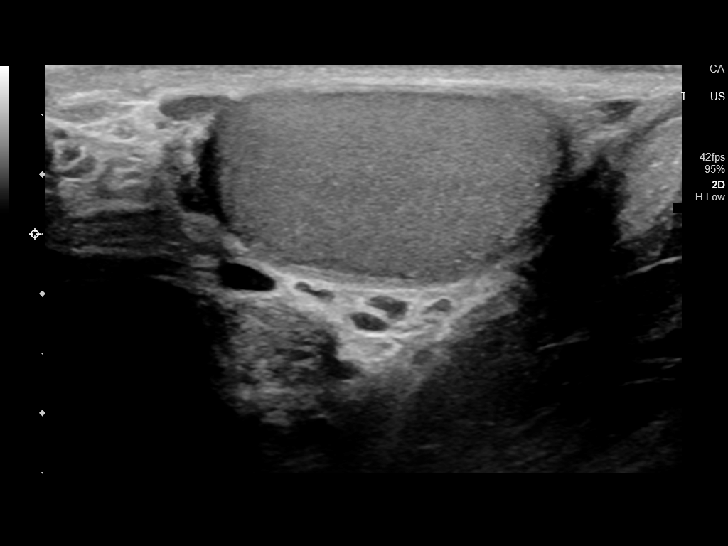
[im 18/35]
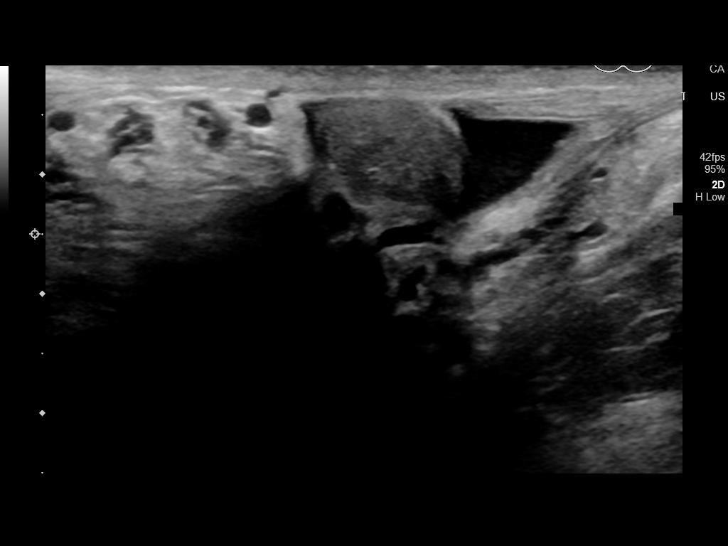
[im 20/35]
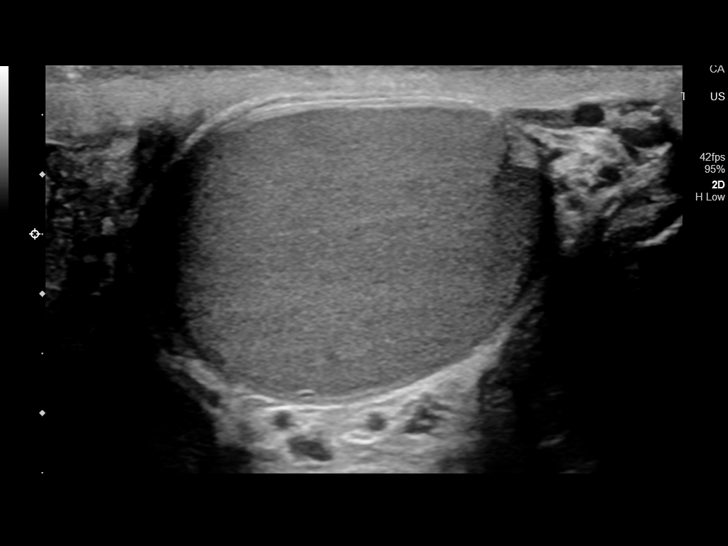
[im 23/35]
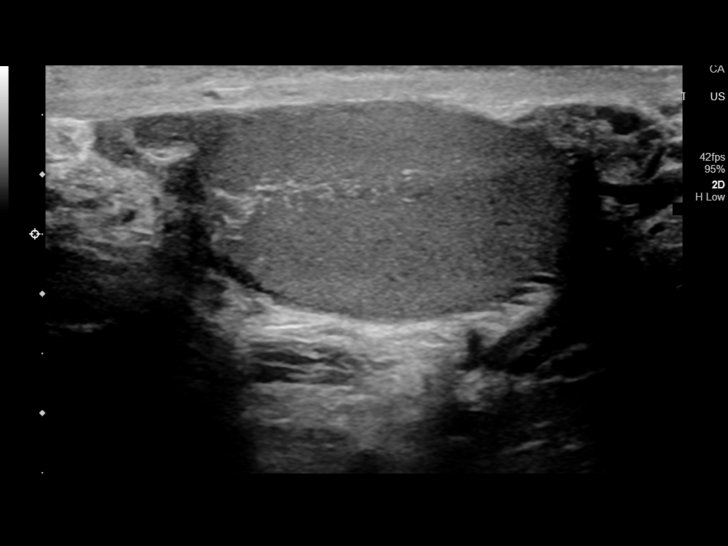
[im 26/35]
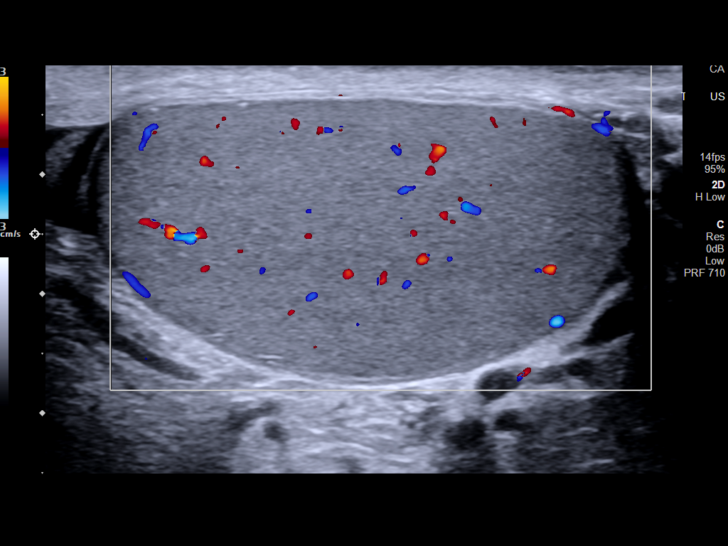
[im 29/35]
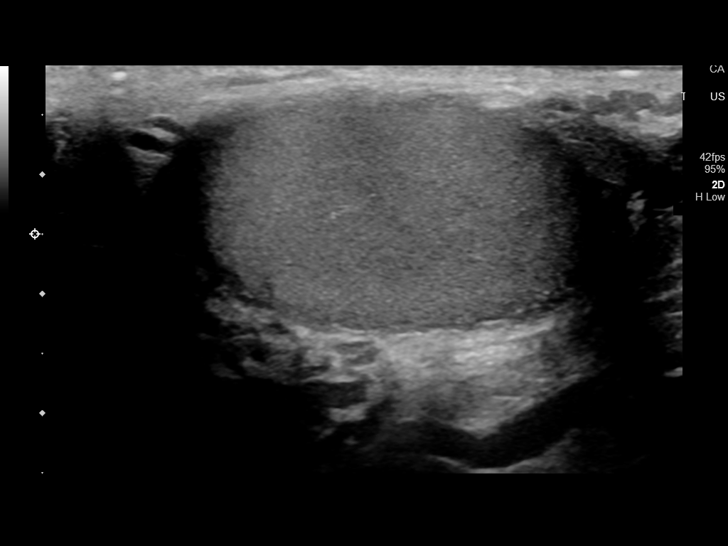
[im 32/35]
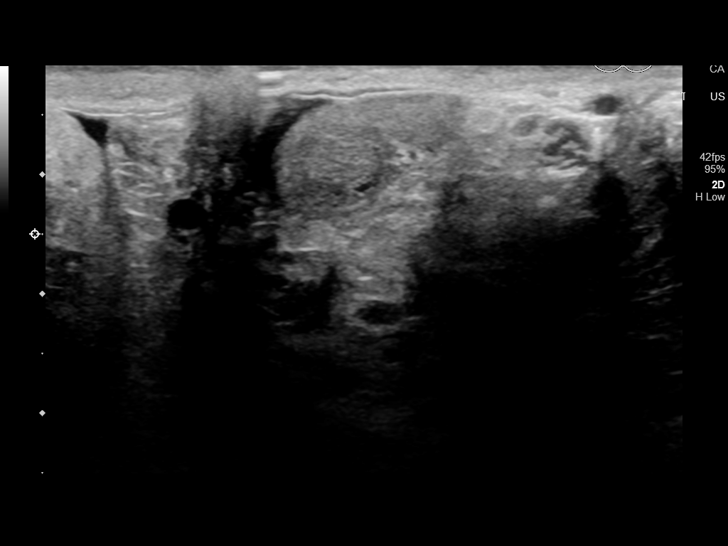
[im 35/35]
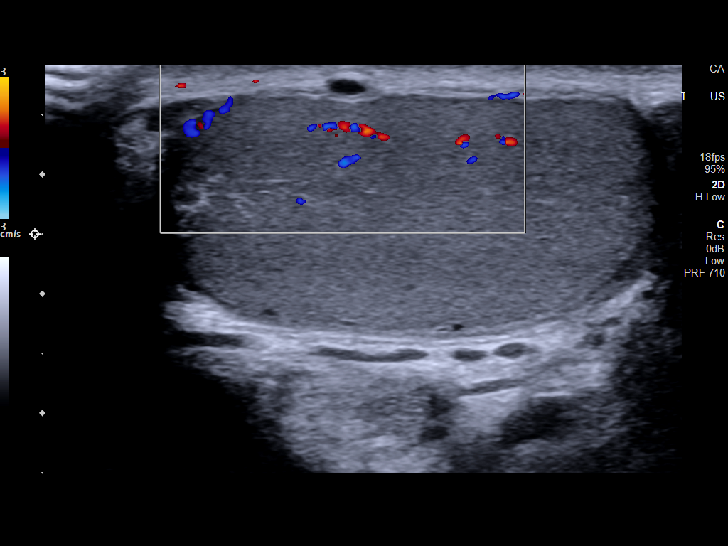

[13 of 25 positions shown; findings below may reference images not displayed]

FINDINGS: Right testicle

Measurements: 4.7 x 3.1 x 1.9 cm. No mass or microlithiasis
visualized.

Left testicle

Measurements: 4.6 x 3.1 x 2.4 cm. No mass or microlithiasis
visualized.

Right epididymis:  Normal in size and appearance.

Left epididymis:  Normal in size and appearance.

Hydrocele:  None visualized.

Varicocele:  None visualized.

Pulsed Doppler interrogation of both testes demonstrates normal low
resistance arterial and venous waveforms on the LEFT with some
possible blunting of the arterial upstroke in the RIGHT testicle.

Other: Anechoic 3.5 mm subcutaneous cyst in the right hemiscrotum.
IMPRESSION: 1. Possible blunting of the arterial upstroke in the right testicle,
but with similar appearance of the vascularity on color Doppler in
the bilateral testicles, findings which are nonspecific and may be
artifactual however intermittent/partial torsion can not be entirely
excluded. Recommend close interval clinical follow-up and possible
repeat ultrasound with Doppler as clinically indicated.
2. Normal grayscale appearance of the bilateral testicles.
3. Anechoic 3.5 mm subcutaneous cyst in the right hemiscrotum,
likely a sebaceous cyst or other benign finding.

## 2023-12-24 DIAGNOSIS — I25110 Atherosclerotic heart disease of native coronary artery with unstable angina pectoris: Secondary | ICD-10-CM | POA: Insufficient documentation

## 2024-01-07 ENCOUNTER — Encounter: Attending: Cardiology | Admitting: *Deleted

## 2024-01-07 DIAGNOSIS — Z955 Presence of coronary angioplasty implant and graft: Secondary | ICD-10-CM

## 2024-01-07 NOTE — Progress Notes (Signed)
 Initial phone call completed. Diagnosis can be found in [facility] 7/21. EP Orientation scheduled for Wednesday 8/6 at 1:45.

## 2024-01-13 ENCOUNTER — Encounter: Attending: Cardiology

## 2024-01-13 VITALS — Ht 66.8 in | Wt 221.0 lb

## 2024-01-13 DIAGNOSIS — Z955 Presence of coronary angioplasty implant and graft: Secondary | ICD-10-CM | POA: Diagnosis present

## 2024-01-13 NOTE — Progress Notes (Signed)
 Cardiac Individual Treatment Plan  Patient Details  Name: Leroy Hill MRN: [id number] Date of Birth: [birth year] Referring Provider:   Flowsheet Row Cardiac Rehab from 01/13/2024 in [facility] Cardiac and Pulmonary Rehab  Referring Provider Dr. Zachary Car    Initial Encounter Date:  Flowsheet Row Cardiac Rehab from 01/13/2024 in [facility] Cardiac and Pulmonary Rehab  Date 01/13/24    Visit Diagnosis: Status post coronary artery stent placement  Patient's Home Medications on Admission:  Current Outpatient Medications:    aspirin 81 MG chewable tablet, Chew 81 mg by mouth., Disp: , Rfl:    atorvastatin (LIPITOR) 80 MG tablet, Take 80 mg by mouth daily., Disp: , Rfl:    EPINEPHrine 0.3 mg/0.3 mL IJ SOAJ injection, Inject into the muscle., Disp: , Rfl:    ezetimibe (ZETIA) 10 MG tablet, Take 10 mg by mouth daily., Disp: , Rfl:    losartan (COZAAR) 50 MG tablet, Take 50 mg by mouth., Disp: , Rfl:    nitroGLYCERIN (NITROSTAT) 0.4 MG SL tablet, Place 0.4 mg under the tongue., Disp: , Rfl:    ticagrelor (BRILINTA) 90 MG TABS tablet, Take 90 mg by mouth., Disp: , Rfl:   Past Medical History: Past Medical History:  Diagnosis Date   Hypertension     Tobacco Use: Social History   Tobacco Use  Smoking Status Some Days  Smokeless Tobacco Current    Labs: Review Flowsheet        No data to display           Exercise Target Goals: Exercise Program Goal: Individual exercise prescription set using results from initial 6 min walk test and THRR while considering  patient's activity barriers and safety.   Exercise Prescription Goal: Initial exercise prescription builds to 30-45 minutes a day of aerobic activity, 2-3 days per week.  Home exercise guidelines will be given to patient during program as part of exercise prescription that the participant will acknowledge.   Education: Aerobic Exercise: - Group verbal and visual presentation on the components of exercise prescription.  Introduces F.I.T.T principle from ACSM for exercise prescriptions.  Reviews F.I.T.T. principles of aerobic exercise including progression. Written material given at graduation.   Education: Resistance Exercise: - Group verbal and visual presentation on the components of exercise prescription. Introduces F.I.T.T principle from ACSM for exercise prescriptions  Reviews F.I.T.T. principles of resistance exercise including progression. Written material given at graduation.    Education: Exercise & Equipment Safety: - Individual verbal instruction and demonstration of equipment use and safety with use of the equipment. Flowsheet Row Cardiac Rehab from 01/13/2024 in [facility] Cardiac and Pulmonary Rehab  Date 01/13/24  Educator [facility]  Instruction Review Code 1- Verbalizes Understanding    Education: Exercise Physiology & General Exercise Guidelines: - Group verbal and written instruction with models to review the exercise physiology of the cardiovascular system and associated critical values. Provides general exercise guidelines with specific guidelines to those with heart or lung disease.    Education: Flexibility, Balance, Mind/Body Relaxation: - Group verbal and visual presentation with interactive activity on the components of exercise prescription. Introduces F.I.T.T principle from ACSM for exercise prescriptions. Reviews F.I.T.T. principles of flexibility and balance exercise training including progression. Also discusses the mind body connection.  Reviews various relaxation techniques to help reduce and manage stress (i.e. Deep breathing, progressive muscle relaxation, and visualization). Balance handout provided to take home. Written material given at graduation.   Activity Barriers & Risk Stratification:  Activity Barriers & Cardiac Risk Stratification - 01/13/24  1522       Activity Barriers & Cardiac Risk Stratification   Activity Barriers None    Cardiac Risk Stratification Moderate           6 Minute Walk:  6 Minute Walk     Row Name 01/13/24 1519         6 Minute Walk   Phase Initial     Distance 1570 feet     Walk Time 6 minutes     # of Rest Breaks 0     MPH 2.97     METS 4.77     RPE 7     Perceived Dyspnea  0     VO2 Peak 16.7     Symptoms No     Resting HR 69 bpm     Resting BP 116/64     Resting Oxygen Saturation  98 %     Exercise Oxygen Saturation  during 6 min walk 98 %     Max Ex. HR 106 bpm     Max Ex. BP 134/66     2 Minute Post BP 126/68        Oxygen Initial Assessment:   Oxygen Re-Evaluation:   Oxygen Discharge (Final Oxygen Re-Evaluation):   Initial Exercise Prescription:  Initial Exercise Prescription - 01/13/24 1500       Date of Initial Exercise RX and Referring Provider   Date 01/13/24    Referring Provider Dr. Zachary Car      Oxygen   Maintain Oxygen Saturation 88% or higher      Treadmill   MPH 3    Grade 1    Minutes 15    METs 3.71      NuStep   Level 3    SPM 80    Minutes 15    METs 4.77      Elliptical   Level 1    Speed 3    Minutes 15    METs 4.77      REL-XR   Level 3    Watts 25    Speed 50    Minutes 15    METs 4.77      Rower   Level 5    Watts 25    Minutes 15    METs 4.77      Prescription Details   Duration Progress to 30 minutes of continuous aerobic without signs/symptoms of physical distress      Intensity   THRR 40-80% of Max Heartrate 114-159    Ratings of Perceived Exertion 11-13    Perceived Dyspnea 0-4      Progression   Progression Continue to progress workloads to maintain intensity without signs/symptoms of physical distress.      Resistance Training   Training Prescription Yes    Weight 10lb    Reps 10-15          Perform Capillary Blood Glucose checks as needed.  Exercise Prescription Changes:   Exercise Prescription Changes     Row Name 01/13/24 1500             Response to Exercise   Blood Pressure (Admit) 116/64       Blood  Pressure (Exercise) 134/66       Blood Pressure (Exit) 126/68       Heart Rate (Admit) 69 bpm       Heart Rate (Exercise) 106 bpm       Heart Rate (Exit) 77 bpm  Oxygen Saturation (Admit) 98 %       Oxygen Saturation (Exercise) 98 %       Oxygen Saturation (Exit) 98 %       Rating of Perceived Exertion (Exercise) 7       Perceived Dyspnea (Exercise) 0       Symptoms none       Comments results          Exercise Comments:   Exercise Goals and Review:   Exercise Goals     Row Name 01/13/24 1525             Exercise Goals   Increase Physical Activity Yes       Intervention Provide advice, education, support and counseling about physical activity/exercise needs.;Develop an individualized exercise prescription for aerobic and resistive training based on initial evaluation findings, risk stratification, comorbidities and participant's personal goals.       Expected Outcomes Short Term: Attend rehab on a regular basis to increase amount of physical activity.;Long Term: Add in home exercise to make exercise part of routine and to increase amount of physical activity.;Long Term: Exercising regularly at least 3-5 days a week.       Increase Strength and Stamina Yes       Intervention Provide advice, education, support and counseling about physical activity/exercise needs.;Develop an individualized exercise prescription for aerobic and resistive training based on initial evaluation findings, risk stratification, comorbidities and participant's personal goals.       Expected Outcomes Short Term: Increase workloads from initial exercise prescription for resistance, speed, and METs.;Short Term: Perform resistance training exercises routinely during rehab and add in resistance training at home;Long Term: Improve cardiorespiratory fitness, muscular endurance and strength as measured by increased METs and functional capacity ( )       Able to understand and use rate of perceived  exertion (RPE) scale Yes       Intervention Provide education and explanation on how to use RPE scale       Expected Outcomes Short Term: Able to use RPE daily in rehab to express subjective intensity level;Long Term:  Able to use RPE to guide intensity level when exercising independently       Able to understand and use Dyspnea scale Yes       Intervention Provide education and explanation on how to use Dyspnea scale       Expected Outcomes Short Term: Able to use Dyspnea scale daily in rehab to express subjective sense of shortness of breath during exertion;Long Term: Able to use Dyspnea scale to guide intensity level when exercising independently       Knowledge and understanding of Target Heart Rate Range (THRR) Yes       Intervention Provide education and explanation of THRR including how the numbers were predicted and where they are located for reference       Expected Outcomes Short Term: Able to state/look up THRR;Short Term: Able to use daily as guideline for intensity in rehab;Long Term: Able to use THRR to govern intensity when exercising independently       Able to check pulse independently Yes       Intervention Provide education and demonstration on how to check pulse in carotid and radial arteries.;Review the importance of being able to check your own pulse for safety during independent exercise       Expected Outcomes Short Term: Able to explain why pulse checking is important during independent exercise;Long Term: Able to check pulse  independently and accurately       Understanding of Exercise Prescription Yes       Intervention Provide education, explanation, and written materials on patient's individual exercise prescription       Expected Outcomes Short Term: Able to explain program exercise prescription;Long Term: Able to explain home exercise prescription to exercise independently          Exercise Goals Re-Evaluation :   Discharge Exercise Prescription (Final Exercise  Prescription Changes):  Exercise Prescription Changes - 01/13/24 1500       Response to Exercise   Blood Pressure (Admit) 116/64    Blood Pressure (Exercise) 134/66    Blood Pressure (Exit) 126/68    Heart Rate (Admit) 69 bpm    Heart Rate (Exercise) 106 bpm    Heart Rate (Exit) 77 bpm    Oxygen Saturation (Admit) 98 %    Oxygen Saturation (Exercise) 98 %    Oxygen Saturation (Exit) 98 %    Rating of Perceived Exertion (Exercise) 7    Perceived Dyspnea (Exercise) 0    Symptoms none    Comments results          Nutrition:  Target Goals: Understanding of nutrition guidelines, daily intake of sodium 1500mg , cholesterol 200mg , calories 30% from fat and 7% or less from saturated fats, daily to have 5 or more servings of fruits and vegetables.  Education: All About Nutrition: -Group instruction provided by verbal, written material, interactive activities, discussions, models, and posters to present general guidelines for heart healthy nutrition including fat, fiber, MyPlate, the role of sodium in heart healthy nutrition, utilization of the nutrition label, and utilization of this knowledge for meal planning. Follow up email sent as well. Written material given at graduation. Flowsheet Row Cardiac Rehab from 01/13/2024 in [facility] Cardiac and Pulmonary Rehab  Education need identified 01/13/24    Biometrics:  Pre Biometrics - 01/13/24 1526       Pre Biometrics   Height 5' 6.8 (1.697 m)    Weight 221 lb (100.2 kg)    Waist Circumference 40 inches    Hip Circumference 43 inches    Waist to Hip Ratio 0.93 %    BMI (Calculated) 34.81    Single Leg Stand 30 seconds           Nutrition Therapy Plan and Nutrition Goals:   Nutrition Assessments:  MEDIFICTS Score Key: >=70 Need to make dietary changes  40-70 Heart Healthy Diet <= 40 Therapeutic Level Cholesterol Diet  Flowsheet Row Cardiac Rehab from 01/13/2024 in [facility] Cardiac and Pulmonary Rehab  Picture Your Plate Total  Score on Admission 66   Picture Your Plate Scores: <59 Unhealthy dietary pattern with much room for improvement. 41-50 Dietary pattern unlikely to meet recommendations for good health and room for improvement. 51-60 More healthful dietary pattern, with some room for improvement.  >60 Healthy dietary pattern, although there may be some specific behaviors that could be improved.    Nutrition Goals Re-Evaluation:   Nutrition Goals Discharge (Final Nutrition Goals Re-Evaluation):   Psychosocial: Target Goals: Acknowledge presence or absence of significant depression and/or stress, maximize coping skills, provide positive support system. Participant is able to verbalize types and ability to use techniques and skills needed for reducing stress and depression.   Education: Stress, Anxiety, and Depression - Group verbal and visual presentation to define topics covered.  Reviews how body is impacted by stress, anxiety, and depression.  Also discusses healthy ways to reduce stress and to treat/manage  anxiety and depression.  Written material given at graduation.   Education: Sleep Hygiene -Provides group verbal and written instruction about how sleep can affect your health.  Define sleep hygiene, discuss sleep cycles and impact of sleep habits. Review good sleep hygiene tips.    Initial Review & Psychosocial Screening:  Initial Psych Review & Screening - 01/07/24 1022       Initial Review   Current issues with Current Stress Concerns    Source of Stress Concerns Financial      Family Dynamics   Good Support System? Yes      Barriers   Psychosocial barriers to participate in program There are no identifiable barriers or psychosocial needs.;The patient should benefit from training in stress management and relaxation.      Screening Interventions   Interventions To provide support and resources with identified psychosocial needs;Encouraged to exercise;Provide feedback about the scores to  participant    Expected Outcomes Short Term goal: Utilizing psychosocial counselor, staff and physician to assist with identification of specific Stressors or current issues interfering with healing process. Setting desired goal for each stressor or current issue identified.;Long Term Goal: Stressors or current issues are controlled or eliminated.;Long Term goal: The participant improves quality of Life and PHQ9 Scores as seen by post scores and/or verbalization of changes;Short Term goal: Identification and review with participant of any Quality of Life or Depression concerns found by scoring the questionnaire.          Quality of Life Scores:   Quality of Life - 01/13/24 1527       Quality of Life   Select Quality of Life      Quality of Life Scores   Health/Function Pre 13.27 %    Socioeconomic Pre 11.56 %    Psych/Spiritual Pre 16.43 %    Family Pre 27.6 %    GLOBAL Pre 15.56 %         Scores of 19 and below usually indicate a poorer quality of life in these areas.  A difference of  2-3 points is a clinically meaningful difference.  A difference of 2-3 points in the total score of the Quality of Life Index has been associated with significant improvement in overall quality of life, self-image, physical symptoms, and general health in studies assessing change in quality of life.  PHQ-9: Review Flowsheet        No data to display         Interpretation of Total Score  Total Score Depression Severity:  1-4 = Minimal depression, 5-9 = Mild depression, 10-14 = Moderate depression, 15-19 = Moderately severe depression, 20-27 = Severe depression   Psychosocial Evaluation and Intervention:  Psychosocial Evaluation - 01/07/24 1027       Psychosocial Evaluation & Interventions   Interventions Encouraged to exercise with the program and follow exercise prescription;Stress management education;Relaxation education    Comments Harjit is coming to cardiac rehab after a stent  placement. He has returned to work in Aeronautical engineer and has noticed how he gets tired easier. 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 He mentions feeling like he isn't able to keep up like he used to and doesn't know if this is his new normal. He is ready to get started in the program to learn more about heart healthy living and eating.  Expected Outcomes Short: attend cardiac rehab for education and exercise Long: develop and maintain positive self care habits.    Continue Psychosocial Services  Follow up required by staff          Psychosocial Re-Evaluation:   Psychosocial Discharge (Final Psychosocial Re-Evaluation):   Vocational Rehabilitation: Provide vocational rehab assistance to qualifying candidates.   Vocational Rehab Evaluation & Intervention:  Vocational Rehab - 01/07/24 1022       Initial Vocational Rehab Evaluation & Intervention   Assessment shows need for Vocational Rehabilitation No          Education: Education Goals: Education classes will be provided on a variety of topics geared toward better understanding of heart health and risk factor modification. Participant will state understanding/return demonstration of topics presented as noted by education test scores.  Learning Barriers/Preferences:  Learning Barriers/Preferences - 01/07/24 1022       Learning Barriers/Preferences   Learning Barriers None    Learning Preferences None          General Cardiac Education Topics:  AED/CPR: - Group verbal and written instruction with the use of models to demonstrate the basic use of the AED with the basic ABC's of resuscitation.   Anatomy and Cardiac Procedures: - Group verbal and visual presentation and models provide information about basic cardiac anatomy and function. Reviews the testing methods done to diagnose heart  disease and the outcomes of the test results. Describes the treatment choices: Medical Management, Angioplasty, or Coronary Bypass Surgery for treating various heart conditions including Myocardial Infarction, Angina, Valve Disease, and Cardiac Arrhythmias.  Written material given at graduation.   Medication Safety: - Group verbal and visual instruction to review commonly prescribed medications for heart and lung disease. Reviews the medication, class of the drug, and side effects. Includes the steps to properly store meds and maintain the prescription regimen.  Written material given at graduation.   Intimacy: - Group verbal instruction through game format to discuss how heart and lung disease can affect sexual intimacy. Written material given at graduation..   Know Your Numbers and Heart Failure: - Group verbal and visual instruction to discuss disease risk factors for cardiac and pulmonary disease and treatment options.  Reviews associated critical values for Overweight/Obesity, Hypertension, Cholesterol, and Diabetes.  Discusses basics of heart failure: signs/symptoms and treatments.  Introduces Heart Failure Zone chart for action plan for heart failure.  Written material given at graduation.   Infection Prevention: - Provides verbal and written material to individual with discussion of infection control including proper hand washing and proper equipment cleaning during exercise session. Flowsheet Row Cardiac Rehab from 01/13/2024 in [facility] Cardiac and Pulmonary Rehab  Date 01/13/24  Educator [facility]  Instruction Review Code 1- Verbalizes Understanding    Falls Prevention: - Provides verbal and written material to individual with discussion of falls prevention and safety. Flowsheet Row Cardiac Rehab from 01/13/2024 in [facility] Cardiac and Pulmonary Rehab  Date 01/13/24  Educator [facility]  Instruction Review Code 1- Verbalizes Understanding    Other: -Provides group and verbal instruction on various  topics (see comments)   Knowledge Questionnaire Score:  Knowledge Questionnaire Score - 01/13/24 1528       Knowledge Questionnaire Score   Pre Score 25/26          Core Components/Risk Factors/Patient Goals at Admission:  Personal Goals and Risk Factors at Admission - 01/07/24 1028       Core Components/Risk Factors/Patient Goals on Admission   Hypertension Yes  Intervention Provide education on lifestyle modifcations including regular physical activity/exercise, weight management, moderate sodium restriction and increased consumption of fresh fruit, vegetables, and low fat dairy, alcohol moderation, and smoking cessation.;Monitor prescription use compliance.    Expected Outcomes Short Term: Continued assessment and intervention until BP is < 140/60mm HG in hypertensive participants. < 130/60mm HG in hypertensive participants with diabetes, heart failure or chronic kidney disease.;Long Term: Maintenance of blood pressure at goal levels.    Lipids Yes    Intervention Provide education and support for participant on nutrition & aerobic/resistive exercise along with prescribed medications to achieve LDL 70mg , HDL >40mg .    Expected Outcomes Short Term: Participant states understanding of desired cholesterol values and is compliant with medications prescribed. Participant is following exercise prescription and nutrition guidelines.;Long Term: Cholesterol controlled with medications as prescribed, with individualized exercise RX and with personalized nutrition plan. Value goals: LDL < 70mg , HDL > 40 mg.          Education:Diabetes - Individual verbal and written instruction to review signs/symptoms of diabetes, desired ranges of glucose level fasting, after meals and with exercise. Acknowledge that pre and post exercise glucose checks will be done for 3 sessions at entry of program.   Core Components/Risk Factors/Patient Goals Review:    Core Components/Risk Factors/Patient Goals  at Discharge (Final Review):    ITP Comments:  ITP Comments     Row Name 01/07/24 1012 01/13/24 1519         ITP Comments Initial phone call completed. Diagnosis can be found in [facility] 7/21. EP Orientation scheduled for Wednesday 8/6 at 1:45. Completed and gym orientation for cardiac rehab. Initial ITP created and sent for review to Dr. Oneil Pinal, Medical Director.         Comments: Initial ITP

## 2024-01-13 NOTE — Patient Instructions (Signed)
 Patient Instructions  Patient Details  Name: Leroy Hill MRN: [id number] Date of Birth: [birth year] Referring Provider:  Patricia Zachary LABOR, MD  Below are your personal goals for exercise, nutrition, and risk factors. Our goal is to help you stay on track towards obtaining and maintaining these goals. We will be discussing your progress on these goals with you throughout the program.  Initial Exercise Prescription:  Initial Exercise Prescription - 01/13/24 1500       Date of Initial Exercise RX and Referring Provider   Date 01/13/24    Referring Provider Dr. Zachary Patricia      Oxygen   Maintain Oxygen Saturation 88% or higher      Treadmill   MPH 3    Grade 1    Minutes 15    METs 3.71      NuStep   Level 3    SPM 80    Minutes 15    METs 4.77      Elliptical   Level 1    Speed 3    Minutes 15    METs 4.77      REL-XR   Level 3    Watts 25    Speed 50    Minutes 15    METs 4.77      Rower   Level 5    Watts 25    Minutes 15    METs 4.77      Prescription Details   Duration Progress to 30 minutes of continuous aerobic without signs/symptoms of physical distress      Intensity   THRR 40-80% of Max Heartrate 114-159    Ratings of Perceived Exertion 11-13    Perceived Dyspnea 0-4      Progression   Progression Continue to progress workloads to maintain intensity without signs/symptoms of physical distress.      Resistance Training   Training Prescription Yes    Weight 10lb    Reps 10-15          Exercise Goals: Frequency: Be able to perform aerobic exercise two to three times per week in program working toward 2-5 days per week of home exercise.  Intensity: Work with a perceived exertion of 11 (fairly light) - 15 (hard) while following your exercise prescription.  We will make changes to your prescription with you as you progress through the program.   Duration: Be able to do 30 to 45 minutes of continuous aerobic exercise in addition to  a 5 minute warm-up and a 5 minute cool-down routine.   Nutrition Goals: Your personal nutrition goals will be established when you do your nutrition analysis with the dietician.  The following are general nutrition guidelines to follow: Cholesterol < 200mg /day Sodium < 1500mg /day Fiber: Men under 50 yrs - 38 grams per day  Personal Goals:  Personal Goals and Risk Factors at Admission - 01/07/24 1028       Core Components/Risk Factors/Patient Goals on Admission   Hypertension Yes    Intervention Provide education on lifestyle modifcations including regular physical activity/exercise, weight management, moderate sodium restriction and increased consumption of fresh fruit, vegetables, and low fat dairy, alcohol moderation, and smoking cessation.;Monitor prescription use compliance.    Expected Outcomes Short Term: Continued assessment and intervention until BP is < 140/59mm HG in hypertensive participants. < 130/60mm HG in hypertensive participants with diabetes, heart failure or chronic kidney disease.;Long Term: Maintenance of blood pressure at goal levels.    Lipids Yes    Intervention  Provide education and support for participant on nutrition & aerobic/resistive exercise along with prescribed medications to achieve LDL 70mg , HDL >40mg .    Expected Outcomes Short Term: Participant states understanding of desired cholesterol values and is compliant with medications prescribed. Participant is following exercise prescription and nutrition guidelines.;Long Term: Cholesterol controlled with medications as prescribed, with individualized exercise RX and with personalized nutrition plan. Value goals: LDL < 70mg , HDL > 40 mg.          Exercise Goals and Review:  Exercise Goals     Row Name 01/13/24 1525             Exercise Goals   Increase Physical Activity Yes       Intervention Provide advice, education, support and counseling about physical activity/exercise needs.;Develop an  individualized exercise prescription for aerobic and resistive training based on initial evaluation findings, risk stratification, comorbidities and participant's personal goals.       Expected Outcomes Short Term: Attend rehab on a regular basis to increase amount of physical activity.;Long Term: Add in home exercise to make exercise part of routine and to increase amount of physical activity.;Long Term: Exercising regularly at least 3-5 days a week.       Increase Strength and Stamina Yes       Intervention Provide advice, education, support and counseling about physical activity/exercise needs.;Develop an individualized exercise prescription for aerobic and resistive training based on initial evaluation findings, risk stratification, comorbidities and participant's personal goals.       Expected Outcomes Short Term: Increase workloads from initial exercise prescription for resistance, speed, and METs.;Short Term: Perform resistance training exercises routinely during rehab and add in resistance training at home;Long Term: Improve cardiorespiratory fitness, muscular endurance and strength as measured by increased METs and functional capacity ( )       Able to understand and use rate of perceived exertion (RPE) scale Yes       Intervention Provide education and explanation on how to use RPE scale       Expected Outcomes Short Term: Able to use RPE daily in rehab to express subjective intensity level;Long Term:  Able to use RPE to guide intensity level when exercising independently       Able to understand and use Dyspnea scale Yes       Intervention Provide education and explanation on how to use Dyspnea scale       Expected Outcomes Short Term: Able to use Dyspnea scale daily in rehab to express subjective sense of shortness of breath during exertion;Long Term: Able to use Dyspnea scale to guide intensity level when exercising independently       Knowledge and understanding of Target Heart Rate Range  (THRR) Yes       Intervention Provide education and explanation of THRR including how the numbers were predicted and where they are located for reference       Expected Outcomes Short Term: Able to state/look up THRR;Short Term: Able to use daily as guideline for intensity in rehab;Long Term: Able to use THRR to govern intensity when exercising independently       Able to check pulse independently Yes       Intervention Provide education and demonstration on how to check pulse in carotid and radial arteries.;Review the importance of being able to check your own pulse for safety during independent exercise       Expected Outcomes Short Term: Able to explain why pulse checking is important during independent exercise;Long Term:  Able to check pulse independently and accurately       Understanding of Exercise Prescription Yes       Intervention Provide education, explanation, and written materials on patient's individual exercise prescription       Expected Outcomes Short Term: Able to explain program exercise prescription;Long Term: Able to explain home exercise prescription to exercise independently          Copy of goals given to participant.

## 2024-01-18 ENCOUNTER — Encounter

## 2024-01-20 ENCOUNTER — Ambulatory Visit

## 2024-01-25 ENCOUNTER — Encounter

## 2024-01-27 ENCOUNTER — Encounter

## 2024-01-27 DIAGNOSIS — Z955 Presence of coronary angioplasty implant and graft: Secondary | ICD-10-CM | POA: Diagnosis not present

## 2024-01-27 NOTE — Progress Notes (Signed)
 Daily Session Note  Patient Details  Name: Leroy Hill MRN: [id number] Date of Birth: [birth year] Referring Provider:   Flowsheet Row Cardiac Rehab from 01/13/2024 in [facility] Cardiac and Pulmonary Rehab  Referring Provider Dr. Zachary Car    Encounter Date: 01/27/2024  Check In:  Session Check In - 01/27/24 1547       Check-In   Supervising physician immediately available to respond to emergencies See telemetry face sheet for immediately available ER MD    Location [facility]-Cardiac & Pulmonary Rehab    Staff Present Burnard Davenport RN,BSN,MPA;Joseph Rolinda RCP,RRT,BSRT;Laura Cates RN,BSN;Monti Villers Dyane BS, ACSM CEP, Exercise Physiologist    Virtual Visit No    Medication changes reported     No    Fall or balance concerns reported    No    Tobacco Cessation No Change    Warm-up and Cool-down Performed on first and last piece of equipment    Resistance Training Performed Yes    VAD Patient? No    PAD/SET Patient? No      Pain Assessment   Currently in Pain? No/denies             Social History   Tobacco Use  Smoking Status Some Days  Smokeless Tobacco Current    Goals Met:  Independence with exercise equipment Exercise tolerated well No report of concerns or symptoms today Strength training completed today  Goals Unmet:  Not Applicable  Comments: First full day of exercise!  Patient was oriented to gym and equipment including functions, settings, policies, and procedures.  Patient's individual exercise prescription and treatment plan were reviewed.  All starting workloads were established based on the results of the 6 minute walk test done at initial orientation visit.  The plan for exercise progression was also introduced and progression will be customized based on patient's performance and goals.    Dr. Oneil Pinal is Medical Director for [facility] Cardiac Rehabilitation.  Dr. Fuad Aleskerov is Medical Director for [facility] Pulmonary Rehabilitation.

## 2024-02-01 ENCOUNTER — Encounter: Admitting: Emergency Medicine

## 2024-02-01 DIAGNOSIS — Z955 Presence of coronary angioplasty implant and graft: Secondary | ICD-10-CM

## 2024-02-01 NOTE — Progress Notes (Signed)
 Daily Session Note  Patient Details  Name: Leroy Hill MRN: [id number] Date of Birth: Nov 17, 1985 Referring Provider:   Flowsheet Row Cardiac Rehab from 01/13/2024 in [facility] Cardiac and Pulmonary Rehab  Referring Provider Dr. Zachary Car    Encounter Date: 02/01/2024  Check In:  Session Check In - 02/01/24 1604       Check-In   Supervising physician immediately available to respond to emergencies See telemetry face sheet for immediately available ER MD    Location [facility]-Cardiac & Pulmonary Rehab    Staff Present Rollene Paterson, MS, Exercise Physiologist;Maxon Conetta BS, Exercise Physiologist;Joseph Rolinda RCP,RRT,BSRT;Jyden Kromer RN,BSN    Virtual Visit No    Medication changes reported     No    Fall or balance concerns reported    No    Tobacco Cessation No Change    Warm-up and Cool-down Performed on first and last piece of equipment    Resistance Training Performed Yes    VAD Patient? No    PAD/SET Patient? No      Pain Assessment   Currently in Pain? No/denies             Social History   Tobacco Use  Smoking Status Some Days  Smokeless Tobacco Current    Goals Met:  Independence with exercise equipment Exercise tolerated well No report of concerns or symptoms today Strength training completed today  Goals Unmet:  Not Applicable  Comments: Pt able to follow exercise prescription today without complaint.  Will continue to monitor for progression.    Dr. Oneil Pinal is Medical Director for [facility] Cardiac Rehabilitation.  Dr. Fuad Aleskerov is Medical Director for [facility] Pulmonary Rehabilitation.

## 2024-02-03 ENCOUNTER — Encounter

## 2024-02-03 DIAGNOSIS — Z955 Presence of coronary angioplasty implant and graft: Secondary | ICD-10-CM

## 2024-02-03 NOTE — Progress Notes (Signed)
 Cardiac Individual Treatment Plan  Patient Details  Name: Leroy Hill MRN: [id number] Date of Birth: [birth year] Referring Provider:   Flowsheet Row Cardiac Rehab from 01/13/2024 in [facility] Cardiac and Pulmonary Rehab  Referring Provider Dr. Zachary Car    Initial Encounter Date:  Flowsheet Row Cardiac Rehab from 01/13/2024 in [facility] Cardiac and Pulmonary Rehab  Date 01/13/24    Visit Diagnosis: Status post coronary artery stent placement  Patient's Home Medications on Admission:  Current Outpatient Medications:    aspirin 81 MG chewable tablet, Chew 81 mg by mouth., Disp: , Rfl:    atorvastatin (LIPITOR) 80 MG tablet, Take 80 mg by mouth daily., Disp: , Rfl:    EPINEPHrine 0.3 mg/0.3 mL IJ SOAJ injection, Inject into the muscle., Disp: , Rfl:    ezetimibe (ZETIA) 10 MG tablet, Take 10 mg by mouth daily., Disp: , Rfl:    losartan (COZAAR) 50 MG tablet, Take 50 mg by mouth., Disp: , Rfl:    nitroGLYCERIN (NITROSTAT) 0.4 MG SL tablet, Place 0.4 mg under the tongue., Disp: , Rfl:    ticagrelor (BRILINTA) 90 MG TABS tablet, Take 90 mg by mouth., Disp: , Rfl:   Past Medical History: Past Medical History:  Diagnosis Date   Hypertension     Tobacco Use: Social History   Tobacco Use  Smoking Status Some Days  Smokeless Tobacco Current    Labs: Review Flowsheet        No data to display           Exercise Target Goals: Exercise Program Goal: Individual exercise prescription set using results from initial 6 min walk test and THRR while considering  patient's activity barriers and safety.   Exercise Prescription Goal: Initial exercise prescription builds to 30-45 minutes a day of aerobic activity, 2-3 days per week.  Home exercise guidelines will be given to patient during program as part of exercise prescription that the participant will acknowledge.   Education: Aerobic Exercise: - Group verbal and visual presentation on the components of exercise prescription.  Introduces F.I.T.T principle from ACSM for exercise prescriptions.  Reviews F.I.T.T. principles of aerobic exercise including progression. Written material provided at class time.   Education: Resistance Exercise: - Group verbal and visual presentation on the components of exercise prescription. Introduces F.I.T.T principle from ACSM for exercise prescriptions  Reviews F.I.T.T. principles of resistance exercise including progression. Written material provided at class time.    Education: Exercise & Equipment Safety: - Individual verbal instruction and demonstration of equipment use and safety with use of the equipment. Flowsheet Row Cardiac Rehab from 01/13/2024 in [facility] Cardiac and Pulmonary Rehab  Date 01/13/24  Educator [facility]  Instruction Review Code 1- Verbalizes Understanding    Education: Exercise Physiology & General Exercise Guidelines: - Group verbal and written instruction with models to review the exercise physiology of the cardiovascular system and associated critical values. Provides general exercise guidelines with specific guidelines to those with heart or lung disease. Written material provided at class time.   Education: Flexibility, Balance, Mind/Body Relaxation: - Group verbal and visual presentation with interactive activity on the components of exercise prescription. Introduces F.I.T.T principle from ACSM for exercise prescriptions. Reviews F.I.T.T. principles of flexibility and balance exercise training including progression. Also discusses the mind body connection.  Reviews various relaxation techniques to help reduce and manage stress (i.e. Deep breathing, progressive muscle relaxation, and visualization). Balance handout provided to take home. Written material provided at class time.   Activity Barriers & Risk Stratification:  Activity Barriers & Cardiac Risk Stratification - 01/13/24 1522       Activity Barriers & Cardiac Risk Stratification   Activity Barriers None     Cardiac Risk Stratification Moderate          6 Minute Walk:  6 Minute Walk     Row Name 01/13/24 1519         6 Minute Walk   Phase Initial     Distance 1570 feet     Walk Time 6 minutes     # of Rest Breaks 0     MPH 2.97     METS 4.77     RPE 7     Perceived Dyspnea  0     VO2 Peak 16.7     Symptoms No     Resting HR 69 bpm     Resting BP 116/64     Resting Oxygen Saturation  98 %     Exercise Oxygen Saturation  during 6 min walk 98 %     Max Ex. HR 106 bpm     Max Ex. BP 134/66     2 Minute Post BP 126/68        Oxygen Initial Assessment:   Oxygen Re-Evaluation:   Oxygen Discharge (Final Oxygen Re-Evaluation):   Initial Exercise Prescription:  Initial Exercise Prescription - 01/13/24 1500       Date of Initial Exercise RX and Referring Provider   Date 01/13/24    Referring Provider Dr. Zachary Car      Oxygen   Maintain Oxygen Saturation 88% or higher      Treadmill   MPH 3    Grade 1    Minutes 15    METs 3.71      NuStep   Level 3    SPM 80    Minutes 15    METs 4.77      Elliptical   Level 1    Speed 3    Minutes 15    METs 4.77      REL-XR   Level 3    Watts 25    Speed 50    Minutes 15    METs 4.77      Rower   Level 5    Watts 25    Minutes 15    METs 4.77      Prescription Details   Duration Progress to 30 minutes of continuous aerobic without signs/symptoms of physical distress      Intensity   THRR 40-80% of Max Heartrate 114-159    Ratings of Perceived Exertion 11-13    Perceived Dyspnea 0-4      Progression   Progression Continue to progress workloads to maintain intensity without signs/symptoms of physical distress.      Resistance Training   Training Prescription Yes    Weight 10lb    Reps 10-15          Perform Capillary Blood Glucose checks as needed.  Exercise Prescription Changes:   Exercise Prescription Changes     Row Name 01/13/24 1500             Response to Exercise    Blood Pressure (Admit) 116/64       Blood Pressure (Exercise) 134/66       Blood Pressure (Exit) 126/68       Heart Rate (Admit) 69 bpm       Heart Rate (Exercise) 106 bpm  Heart Rate (Exit) 77 bpm       Oxygen Saturation (Admit) 98 %       Oxygen Saturation (Exercise) 98 %       Oxygen Saturation (Exit) 98 %       Rating of Perceived Exertion (Exercise) 7       Perceived Dyspnea (Exercise) 0       Symptoms none       Comments results          Exercise Comments:   Exercise Comments     Row Name 01/27/24 1547           Exercise Comments First full day of exercise!  Patient was oriented to gym and equipment including functions, settings, policies, and procedures.  Patient's individual exercise prescription and treatment plan were reviewed.  All starting workloads were established based on the results of the 6 minute walk test done at initial orientation visit.  The plan for exercise progression was also introduced and progression will be customized based on patient's performance and goals.          Exercise Goals and Review:   Exercise Goals     Row Name 01/13/24 1525             Exercise Goals   Increase Physical Activity Yes       Intervention Provide advice, education, support and counseling about physical activity/exercise needs.;Develop an individualized exercise prescription for aerobic and resistive training based on initial evaluation findings, risk stratification, comorbidities and participant's personal goals.       Expected Outcomes Short Term: Attend rehab on a regular basis to increase amount of physical activity.;Long Term: Add in home exercise to make exercise part of routine and to increase amount of physical activity.;Long Term: Exercising regularly at least 3-5 days a week.       Increase Strength and Stamina Yes       Intervention Provide advice, education, support and counseling about physical activity/exercise needs.;Develop an individualized  exercise prescription for aerobic and resistive training based on initial evaluation findings, risk stratification, comorbidities and participant's personal goals.       Expected Outcomes Short Term: Increase workloads from initial exercise prescription for resistance, speed, and METs.;Short Term: Perform resistance training exercises routinely during rehab and add in resistance training at home;Long Term: Improve cardiorespiratory fitness, muscular endurance and strength as measured by increased METs and functional capacity ( )       Able to understand and use rate of perceived exertion (RPE) scale Yes       Intervention Provide education and explanation on how to use RPE scale       Expected Outcomes Short Term: Able to use RPE daily in rehab to express subjective intensity level;Long Term:  Able to use RPE to guide intensity level when exercising independently       Able to understand and use Dyspnea scale Yes       Intervention Provide education and explanation on how to use Dyspnea scale       Expected Outcomes Short Term: Able to use Dyspnea scale daily in rehab to express subjective sense of shortness of breath during exertion;Long Term: Able to use Dyspnea scale to guide intensity level when exercising independently       Knowledge and understanding of Target Heart Rate Range (THRR) Yes       Intervention Provide education and explanation of THRR including how the numbers were predicted and where they are located for  reference       Expected Outcomes Short Term: Able to state/look up THRR;Short Term: Able to use daily as guideline for intensity in rehab;Long Term: Able to use THRR to govern intensity when exercising independently       Able to check pulse independently Yes       Intervention Provide education and demonstration on how to check pulse in carotid and radial arteries.;Review the importance of being able to check your own pulse for safety during independent exercise       Expected  Outcomes Short Term: Able to explain why pulse checking is important during independent exercise;Long Term: Able to check pulse independently and accurately       Understanding of Exercise Prescription Yes       Intervention Provide education, explanation, and written materials on patient's individual exercise prescription       Expected Outcomes Short Term: Able to explain program exercise prescription;Long Term: Able to explain home exercise prescription to exercise independently          Exercise Goals Re-Evaluation :  Exercise Goals Re-Evaluation     Row Name 01/27/24 1547             Exercise Goal Re-Evaluation   Exercise Goals Review Increase Physical Activity;Able to understand and use rate of perceived exertion (RPE) scale;Knowledge and understanding of Target Heart Rate Range (THRR);Understanding of Exercise Prescription;Able to understand and use Dyspnea scale;Able to check pulse independently;Increase Strength and Stamina       Comments Reviewed RPE and dyspnea scale, THR and program prescription with pt today.  Pt voiced understanding and was given a copy of goals to take home.       Expected Outcomes Short: Use RPE daily to regulate intensity. Long: Follow program prescription in THR.          Discharge Exercise Prescription (Final Exercise Prescription Changes):  Exercise Prescription Changes - 01/13/24 1500       Response to Exercise   Blood Pressure (Admit) 116/64    Blood Pressure (Exercise) 134/66    Blood Pressure (Exit) 126/68    Heart Rate (Admit) 69 bpm    Heart Rate (Exercise) 106 bpm    Heart Rate (Exit) 77 bpm    Oxygen Saturation (Admit) 98 %    Oxygen Saturation (Exercise) 98 %    Oxygen Saturation (Exit) 98 %    Rating of Perceived Exertion (Exercise) 7    Perceived Dyspnea (Exercise) 0    Symptoms none    Comments results          Nutrition:  Target Goals: Understanding of nutrition guidelines, daily intake of sodium 1500mg ,  cholesterol 200mg , calories 30% from fat and 7% or less from saturated fats, daily to have 5 or more servings of fruits and vegetables.  Education: Nutrition 1 -Group instruction provided by verbal, written material, interactive activities, discussions, models, and posters to present general guidelines for heart healthy nutrition including macronutrients, label reading, and promoting whole foods over processed counterparts. Education serves as Pensions consultant of discussion of heart healthy eating for all. Written material provided at class time.    Education: Nutrition 2 -Group instruction provided by verbal, written material, interactive activities, discussions, models, and posters to present general guidelines for heart healthy nutrition including sodium, cholesterol, and saturated fat. Providing guidance of habit forming to improve blood pressure, cholesterol, and body weight. Written material provided at class time.     Biometrics:  Pre Biometrics - 01/13/24 1526  Pre Biometrics   Height 5' 6.8 (1.697 m)    Weight 221 lb (100.2 kg)    Waist Circumference 40 inches    Hip Circumference 43 inches    Waist to Hip Ratio 0.93 %    BMI (Calculated) 34.81    Single Leg Stand 30 seconds           Nutrition Therapy Plan and Nutrition Goals:   Nutrition Assessments:  MEDIFICTS Score Key: >=70 Need to make dietary changes  40-70 Heart Healthy Diet <= 40 Therapeutic Level Cholesterol Diet  Flowsheet Row Cardiac Rehab from 01/13/2024 in [facility] Cardiac and Pulmonary Rehab  Picture Your Plate Total Score on Admission 66   Picture Your Plate Scores: <59 Unhealthy dietary pattern with much room for improvement. 41-50 Dietary pattern unlikely to meet recommendations for good health and room for improvement. 51-60 More healthful dietary pattern, with some room for improvement.  >60 Healthy dietary pattern, although there may be some specific behaviors that could be improved.     Nutrition Goals Re-Evaluation:   Nutrition Goals Discharge (Final Nutrition Goals Re-Evaluation):   Psychosocial: Target Goals: Acknowledge presence or absence of significant depression and/or stress, maximize coping skills, provide positive support system. Participant is able to verbalize types and ability to use techniques and skills needed for reducing stress and depression.   Education: Stress, Anxiety, and Depression - Group verbal and visual presentation to define topics covered.  Reviews how body is impacted by stress, anxiety, and depression.  Also discusses healthy ways to reduce stress and to treat/manage anxiety and depression. Written material provided at class time.   Education: Sleep Hygiene -Provides group verbal and written instruction about how sleep can affect your health.  Define sleep hygiene, discuss sleep cycles and impact of sleep habits. Review good sleep hygiene tips.   Initial Review & Psychosocial Screening:  Initial Psych Review & Screening - 01/07/24 1022       Initial Review   Current issues with Current Stress Concerns    Source of Stress Concerns Financial      Family Dynamics   Good Support System? Yes      Barriers   Psychosocial barriers to participate in program There are no identifiable barriers or psychosocial needs.;The patient should benefit from training in stress management and relaxation.      Screening Interventions   Interventions To provide support and resources with identified psychosocial needs;Encouraged to exercise;Provide feedback about the scores to participant    Expected Outcomes Short Term goal: Utilizing psychosocial counselor, staff and physician to assist with identification of specific Stressors or current issues interfering with healing process. Setting desired goal for each stressor or current issue identified.;Long Term Goal: Stressors or current issues are controlled or eliminated.;Long Term goal: The participant  improves quality of Life and PHQ9 Scores as seen by post scores and/or verbalization of changes;Short Term goal: Identification and review with participant of any Quality of Life or Depression concerns found by scoring the questionnaire.          Quality of Life Scores:   Quality of Life - 01/13/24 1527       Quality of Life   Select Quality of Life      Quality of Life Scores   Health/Function Pre 13.27 %    Socioeconomic Pre 11.56 %    Psych/Spiritual Pre 16.43 %    Family Pre 27.6 %    GLOBAL Pre 15.56 %         Scores  of 19 and below usually indicate a poorer quality of life in these areas.  A difference of  2-3 points is a clinically meaningful difference.  A difference of 2-3 points in the total score of the Quality of Life Index has been associated with significant improvement in overall quality of life, self-image, physical symptoms, and general health in studies assessing change in quality of life.  PHQ-9: Review Flowsheet       01/13/2024  Depression screen PHQ 2/9  Decreased Interest 0  Down, Depressed, Hopeless 0  PHQ - 2 Score 0  Altered sleeping 2  Tired, decreased energy 1  Change in appetite 0  Feeling bad or failure about yourself  0  Trouble concentrating 0  Moving slowly or fidgety/restless 0  Suicidal thoughts 0  PHQ-9 Score 3  Difficult doing work/chores Not difficult at all   Interpretation of Total Score  Total Score Depression Severity:  1-4 = Minimal depression, 5-9 = Mild depression, 10-14 = Moderate depression, 15-19 = Moderately severe depression, 20-27 = Severe depression   Psychosocial Evaluation and Intervention:  Psychosocial Evaluation - 01/07/24 1027       Psychosocial Evaluation & Interventions   Interventions Encouraged to exercise with the program and follow exercise prescription;Stress management education;Relaxation education    Comments Leroy Hill is coming to cardiac rehab after a stent placement. He has returned to work in  Aeronautical engineer and has noticed how he gets tired easier. 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 He mentions feeling like he isn't able to keep up like he used to and doesn't know if this is his new normal. He is ready to get started in the program to learn more about heart healthy living and eating.    Expected Outcomes Short: attend cardiac rehab for education and exercise Long: develop and maintain positive self care habits.    Continue Psychosocial Services  Follow up required by staff          Psychosocial Re-Evaluation:   Psychosocial Discharge (Final Psychosocial Re-Evaluation):   Vocational Rehabilitation: Provide vocational rehab assistance to qualifying candidates.   Vocational Rehab Evaluation & Intervention:  Vocational Rehab - 01/07/24 1022       Initial Vocational Rehab Evaluation & Intervention   Assessment shows need for Vocational Rehabilitation No          Education: Education Goals: Education classes will be provided on a variety of topics geared toward better understanding of heart health and risk factor modification. Participant will state understanding/return demonstration of topics presented as noted by education test scores.  Learning Barriers/Preferences:  Learning Barriers/Preferences - 01/07/24 1022       Learning Barriers/Preferences   Learning Barriers None    Learning Preferences None          General Cardiac Education Topics:  AED/CPR: - Group verbal and written instruction with the use of models to demonstrate the basic use of the AED with the basic ABC's of resuscitation.   Test and Procedures: - Group verbal and visual presentation and models provide information about basic cardiac anatomy and function. Reviews the testing methods done to diagnose heart disease and the outcomes of the test results.  Describes the treatment choices: Medical Management, Angioplasty, or Coronary Bypass Surgery for treating various heart conditions including Myocardial Infarction, Angina, Valve Disease, and Cardiac Arrhythmias. Written  material provided at class time.   Medication Safety: - Group verbal and visual instruction to review commonly prescribed medications for heart and lung disease. Reviews the medication, class of the drug, and side effects. Includes the steps to properly store meds and maintain the prescription regimen. Written material provided at class time.   Intimacy: - Group verbal instruction through game format to discuss how heart and lung disease can affect sexual intimacy. Written material provided at class time.   Know Your Numbers and Heart Failure: - Group verbal and visual instruction to discuss disease risk factors for cardiac and pulmonary disease and treatment options.  Reviews associated critical values for Overweight/Obesity, Hypertension, Cholesterol, and Diabetes.  Discusses basics of heart failure: signs/symptoms and treatments.  Introduces Heart Failure Zone chart for action plan for heart failure. Written material provided at class time.   Infection Prevention: - Provides verbal and written material to individual with discussion of infection control including proper hand washing and proper equipment cleaning during exercise session. Flowsheet Row Cardiac Rehab from 01/13/2024 in [facility] Cardiac and Pulmonary Rehab  Date 01/13/24  Educator [facility]  Instruction Review Code 1- Verbalizes Understanding    Falls Prevention: - Provides verbal and written material to individual with discussion of falls prevention and safety. Flowsheet Row Cardiac Rehab from 01/13/2024 in [facility] Cardiac and Pulmonary Rehab  Date 01/13/24  Educator [facility]  Instruction Review Code 1- Verbalizes Understanding    Other: -Provides group and verbal instruction on various topics (see comments)   Knowledge  Questionnaire Score:  Knowledge Questionnaire Score - 01/13/24 1528       Knowledge Questionnaire Score   Pre Score 25/26          Core Components/Risk Factors/Patient Goals at Admission:  Personal Goals and Risk Factors at Admission - 01/27/24 1555       Core Components/Risk Factors/Patient Goals on Admission    Weight Management Yes    Intervention Weight Management: Develop a combined nutrition and exercise program designed to reach desired caloric intake, while maintaining appropriate intake of nutrient and fiber, sodium and fats, and appropriate energy expenditure required for the weight goal.;Weight Management: Provide education and appropriate resources to help participant work on and attain dietary goals.;Weight Management/Obesity: Establish reasonable short term and long term weight goals.;Obesity: Provide education and appropriate resources to help participant work on and attain dietary goals.    Admit Weight 220 lb (99.8 kg)    Goal Weight: Short Term 215 lb (97.5 kg)    Goal Weight: Long Term 190 lb (86.2 kg)    Expected Outcomes Short Term: Continue to assess and modify interventions until short term weight is achieved;Long Term: Adherence to nutrition and physical activity/exercise program aimed toward attainment of established weight goal;Weight Loss: Understanding of general recommendations for a balanced deficit meal plan, which promotes 1-2 lb weight loss per week and includes a negative energy balance of [phone] kcal/d;Understanding recommendations for meals to include 15-35% energy as protein, 25-35% energy from fat, 35-60% energy from carbohydrates, less than 200mg  of dietary cholesterol, 20-35 gm of total fiber daily;Understanding of distribution of calorie intake throughout the day with the consumption of 4-5 meals/snacks    Tobacco Cessation Yes    Number of packs per day vaping    Intervention Assist the participant in steps to quit. Provide individualized education  and counseling about committing to Tobacco Cessation, relapse prevention, and pharmacological support that can be provided by physician.;Education officer, environmental, assist with locating and accessing local/national Quit Smoking  programs, and support quit date choice.    Expected Outcomes Short Term: Will demonstrate readiness to quit, by selecting a quit date.;Short Term: Will quit all tobacco product use, adhering to prevention of relapse plan.;Long Term: Complete abstinence from all tobacco products for at least 12 months from quit date.    Hypertension Yes    Intervention Provide education on lifestyle modifcations including regular physical activity/exercise, weight management, moderate sodium restriction and increased consumption of fresh fruit, vegetables, and low fat dairy, alcohol moderation, and smoking cessation.;Monitor prescription use compliance.    Expected Outcomes Short Term: Continued assessment and intervention until BP is < 140/6mm HG in hypertensive participants. < 130/74mm HG in hypertensive participants with diabetes, heart failure or chronic kidney disease.;Long Term: Maintenance of blood pressure at goal levels.    Lipids Yes    Intervention Provide education and support for participant on nutrition & aerobic/resistive exercise along with prescribed medications to achieve LDL 70mg , HDL >40mg .    Expected Outcomes Short Term: Participant states understanding of desired cholesterol values and is compliant with medications prescribed. Participant is following exercise prescription and nutrition guidelines.;Long Term: Cholesterol controlled with medications as prescribed, with individualized exercise RX and with personalized nutrition plan. Value goals: LDL < 70mg , HDL > 40 mg.          Education:Diabetes - Individual verbal and written instruction to review signs/symptoms of diabetes, desired ranges of glucose level fasting, after meals and with exercise. Acknowledge that pre  and post exercise glucose checks will be done for 3 sessions at entry of program.   Core Components/Risk Factors/Patient Goals Review:    Core Components/Risk Factors/Patient Goals at Discharge (Final Review):    ITP Comments:  ITP Comments     Row Name 01/07/24 1012 01/13/24 1519 01/27/24 1547 02/03/24 0758     ITP Comments Initial phone call completed. Diagnosis can be found in [facility] 7/21. EP Orientation scheduled for Wednesday 8/6 at 1:45. Completed and gym orientation for cardiac rehab. Initial ITP created and sent for review to Dr. Oneil Pinal, Medical Director. First full day of exercise!  Patient was oriented to gym and equipment including functions, settings, policies, and procedures.  Patient's individual exercise prescription and treatment plan were reviewed.  All starting workloads were established based on the results of the 6 minute walk test done at initial orientation visit.  The plan for exercise progression was also introduced and progression will be customized based on patient's performance and goals. 30 Day review completed. Medical Director ITP review done; changes made as directed and signed approval by Medical Director. New to program.       Comments: 30 day review

## 2024-02-10 ENCOUNTER — Ambulatory Visit

## 2024-02-10 ENCOUNTER — Encounter: Attending: Cardiology

## 2024-02-10 DIAGNOSIS — Z955 Presence of coronary angioplasty implant and graft: Secondary | ICD-10-CM | POA: Insufficient documentation

## 2024-02-15 ENCOUNTER — Encounter

## 2024-02-17 ENCOUNTER — Encounter

## 2024-02-22 ENCOUNTER — Encounter

## 2024-02-24 ENCOUNTER — Telehealth: Payer: Self-pay

## 2024-02-24 ENCOUNTER — Encounter

## 2024-02-24 NOTE — Telephone Encounter (Signed)
 Called, no asnwer. No VM setup. Will call again early next week to ask for update on absences.

## 2024-02-29 ENCOUNTER — Encounter

## 2024-03-02 ENCOUNTER — Encounter

## 2024-03-02 DIAGNOSIS — Z955 Presence of coronary angioplasty implant and graft: Secondary | ICD-10-CM

## 2024-03-02 NOTE — Progress Notes (Signed)
 Cardiac Individual Treatment Plan  Patient Details  Name: Leroy Hill MRN: [id number] Date of Birth: 02-10-1986 Referring Provider:   Flowsheet Row Cardiac Rehab from 01/13/2024 in [facility] Cardiac and Pulmonary Rehab  Referring Provider Dr. Zachary Car    Initial Encounter Date:  Flowsheet Row Cardiac Rehab from 01/13/2024 in [facility] Cardiac and Pulmonary Rehab  Date 01/13/24    Visit Diagnosis: Status post coronary artery stent placement  Patient's Home Medications on Admission:  Current Outpatient Medications:    aspirin 81 MG chewable tablet, Chew 81 mg by mouth., Disp: , Rfl:    atorvastatin (LIPITOR) 80 MG tablet, Take 80 mg by mouth daily., Disp: , Rfl:    EPINEPHrine 0.3 mg/0.3 mL IJ SOAJ injection, Inject into the muscle., Disp: , Rfl:    ezetimibe (ZETIA) 10 MG tablet, Take 10 mg by mouth daily., Disp: , Rfl:    losartan (COZAAR) 50 MG tablet, Take 50 mg by mouth., Disp: , Rfl:    nitroGLYCERIN (NITROSTAT) 0.4 MG SL tablet, Place 0.4 mg under the tongue., Disp: , Rfl:    ticagrelor (BRILINTA) 90 MG TABS tablet, Take 90 mg by mouth., Disp: , Rfl:   Past Medical History: Past Medical History:  Diagnosis Date   Hypertension     Tobacco Use: Social History   Tobacco Use  Smoking Status Some Days  Smokeless Tobacco Current    Labs: Review Flowsheet        No data to display           Exercise Target Goals: Exercise Program Goal: Individual exercise prescription set using results from initial 6 min walk test and THRR while considering  patient's activity barriers and safety.   Exercise Prescription Goal: Initial exercise prescription builds to 30-45 minutes a day of aerobic activity, 2-3 days per week.  Home exercise guidelines will be given to patient during program as part of exercise prescription that the participant will acknowledge.   Education: Aerobic Exercise: - Group verbal and visual presentation on the components of exercise prescription.  Introduces F.I.T.T principle from ACSM for exercise prescriptions.  Reviews F.I.T.T. principles of aerobic exercise including progression. Written material provided at class time.   Education: Resistance Exercise: - Group verbal and visual presentation on the components of exercise prescription. Introduces F.I.T.T principle from ACSM for exercise prescriptions  Reviews F.I.T.T. principles of resistance exercise including progression. Written material provided at class time.    Education: Exercise & Equipment Safety: - Individual verbal instruction and demonstration of equipment use and safety with use of the equipment. Flowsheet Row Cardiac Rehab from 01/13/2024 in [facility] Cardiac and Pulmonary Rehab  Date 01/13/24  Educator [facility]  Instruction Review Code 1- Verbalizes Understanding    Education: Exercise Physiology & General Exercise Guidelines: - Group verbal and written instruction with models to review the exercise physiology of the cardiovascular system and associated critical values. Provides general exercise guidelines with specific guidelines to those with heart or lung disease. Written material provided at class time.   Education: Flexibility, Balance, Mind/Body Relaxation: - Group verbal and visual presentation with interactive activity on the components of exercise prescription. Introduces F.I.T.T principle from ACSM for exercise prescriptions. Reviews F.I.T.T. principles of flexibility and balance exercise training including progression. Also discusses the mind body connection.  Reviews various relaxation techniques to help reduce and manage stress (i.e. Deep breathing, progressive muscle relaxation, and visualization). Balance handout provided to take home. Written material provided at class time.   Activity Barriers & Risk Stratification:  Activity Barriers & Cardiac Risk Stratification - 01/13/24 1522       Activity Barriers & Cardiac Risk Stratification   Activity Barriers None     Cardiac Risk Stratification Moderate          6 Minute Walk:  6 Minute Walk     Row Name 01/13/24 1519         6 Minute Walk   Phase Initial     Distance 1570 feet     Walk Time 6 minutes     # of Rest Breaks 0     MPH 2.97     METS 4.77     RPE 7     Perceived Dyspnea  0     VO2 Peak 16.7     Symptoms No     Resting HR 69 bpm     Resting BP 116/64     Resting Oxygen Saturation  98 %     Exercise Oxygen Saturation  during 6 min walk 98 %     Max Ex. HR 106 bpm     Max Ex. BP 134/66     2 Minute Post BP 126/68        Oxygen Initial Assessment:   Oxygen Re-Evaluation:   Oxygen Discharge (Final Oxygen Re-Evaluation):   Initial Exercise Prescription:  Initial Exercise Prescription - 01/13/24 1500       Date of Initial Exercise RX and Referring Provider   Date 01/13/24    Referring Provider Dr. Zachary Car      Oxygen   Maintain Oxygen Saturation 88% or higher      Treadmill   MPH 3    Grade 1    Minutes 15    METs 3.71      NuStep   Level 3    SPM 80    Minutes 15    METs 4.77      Elliptical   Level 1    Speed 3    Minutes 15    METs 4.77      REL-XR   Level 3    Watts 25    Speed 50    Minutes 15    METs 4.77      Rower   Level 5    Watts 25    Minutes 15    METs 4.77      Prescription Details   Duration Progress to 30 minutes of continuous aerobic without signs/symptoms of physical distress      Intensity   THRR 40-80% of Max Heartrate 114-159    Ratings of Perceived Exertion 11-13    Perceived Dyspnea 0-4      Progression   Progression Continue to progress workloads to maintain intensity without signs/symptoms of physical distress.      Resistance Training   Training Prescription Yes    Weight 10lb    Reps 10-15          Perform Capillary Blood Glucose checks as needed.  Exercise Prescription Changes:   Exercise Prescription Changes     Row Name 01/13/24 1500 02/09/24 1400           Response to  Exercise   Blood Pressure (Admit) 116/64 126/72      Blood Pressure (Exercise) 134/66 160/80      Blood Pressure (Exit) 126/68 118/72      Heart Rate (Admit) 69 bpm 75 bpm      Heart Rate (Exercise) 106 bpm 132 bpm  Heart Rate (Exit) 77 bpm 74 bpm      Oxygen Saturation (Admit) 98 % --      Oxygen Saturation (Exercise) 98 % --      Oxygen Saturation (Exit) 98 % --      Rating of Perceived Exertion (Exercise) 7 12      Perceived Dyspnea (Exercise) 0 --      Symptoms none none      Comments results First two days of exercise      Duration -- Continue with 30 min of aerobic exercise without signs/symptoms of physical distress.      Intensity -- THRR unchanged        Progression   Progression -- Continue to progress workloads to maintain intensity without signs/symptoms of physical distress.      Average METs -- 4.23        Resistance Training   Training Prescription -- Yes      Weight -- 10 lb      Reps -- 10-15        Interval Training   Interval Training -- No        Treadmill   MPH -- 3      Grade -- 1      Minutes -- 15      METs -- 3.71        Elliptical   Level -- 5      Speed -- 3.3      Minutes -- 15      METs -- 5.1        Oxygen   Maintain Oxygen Saturation -- 88% or higher         Exercise Comments:   Exercise Comments     Row Name 01/27/24 1547           Exercise Comments First full day of exercise!  Patient was oriented to gym and equipment including functions, settings, policies, and procedures.  Patient's individual exercise prescription and treatment plan were reviewed.  All starting workloads were established based on the results of the 6 minute walk test done at initial orientation visit.  The plan for exercise progression was also introduced and progression will be customized based on patient's performance and goals.          Exercise Goals and Review:   Exercise Goals     Row Name 01/13/24 1525             Exercise Goals    Increase Physical Activity Yes       Intervention Provide advice, education, support and counseling about physical activity/exercise needs.;Develop an individualized exercise prescription for aerobic and resistive training based on initial evaluation findings, risk stratification, comorbidities and participant's personal goals.       Expected Outcomes Short Term: Attend rehab on a regular basis to increase amount of physical activity.;Long Term: Add in home exercise to make exercise part of routine and to increase amount of physical activity.;Long Term: Exercising regularly at least 3-5 days a week.       Increase Strength and Stamina Yes       Intervention Provide advice, education, support and counseling about physical activity/exercise needs.;Develop an individualized exercise prescription for aerobic and resistive training based on initial evaluation findings, risk stratification, comorbidities and participant's personal goals.       Expected Outcomes Short Term: Increase workloads from initial exercise prescription for resistance, speed, and METs.;Short Term: Perform resistance training exercises routinely during rehab and add in  resistance training at home;Long Term: Improve cardiorespiratory fitness, muscular endurance and strength as measured by increased METs and functional capacity ( )       Able to understand and use rate of perceived exertion (RPE) scale Yes       Intervention Provide education and explanation on how to use RPE scale       Expected Outcomes Short Term: Able to use RPE daily in rehab to express subjective intensity level;Long Term:  Able to use RPE to guide intensity level when exercising independently       Able to understand and use Dyspnea scale Yes       Intervention Provide education and explanation on how to use Dyspnea scale       Expected Outcomes Short Term: Able to use Dyspnea scale daily in rehab to express subjective sense of shortness of breath during  exertion;Long Term: Able to use Dyspnea scale to guide intensity level when exercising independently       Knowledge and understanding of Target Heart Rate Range (THRR) Yes       Intervention Provide education and explanation of THRR including how the numbers were predicted and where they are located for reference       Expected Outcomes Short Term: Able to state/look up THRR;Short Term: Able to use daily as guideline for intensity in rehab;Long Term: Able to use THRR to govern intensity when exercising independently       Able to check pulse independently Yes       Intervention Provide education and demonstration on how to check pulse in carotid and radial arteries.;Review the importance of being able to check your own pulse for safety during independent exercise       Expected Outcomes Short Term: Able to explain why pulse checking is important during independent exercise;Long Term: Able to check pulse independently and accurately       Understanding of Exercise Prescription Yes       Intervention Provide education, explanation, and written materials on patient's individual exercise prescription       Expected Outcomes Short Term: Able to explain program exercise prescription;Long Term: Able to explain home exercise prescription to exercise independently          Exercise Goals Re-Evaluation :  Exercise Goals Re-Evaluation     Row Name 01/27/24 1547 02/09/24 1409 02/25/24 1357         Exercise Goal Re-Evaluation   Exercise Goals Review Increase Physical Activity;Able to understand and use rate of perceived exertion (RPE) scale;Knowledge and understanding of Target Heart Rate Range (THRR);Understanding of Exercise Prescription;Able to understand and use Dyspnea scale;Able to check pulse independently;Increase Strength and Stamina Increase Physical Activity;Increase Strength and Stamina;Understanding of Exercise Prescription Increase Physical Activity;Increase Strength and Stamina;Understanding  of Exercise Prescription     Comments Reviewed RPE and dyspnea scale, THR and program prescription with pt today.  Pt voiced understanding and was given a copy of goals to take home. Medford is off to a good start in the program. He did well on the treadmill at a speed of 3 mph with an incline of 1%. He also did well working on the elliptical at level 5. We will continue to monitor his progress in the program. Medford has not attended rehab since the last review. We attempted to call him but he did not answer and his voicemail was not set up. We will continue to monitor his progress when he returns to the program.     Expected Outcomes  Short: Use RPE daily to regulate intensity. Long: Follow program prescription in THR. Short: Continue to follow current exercise prescription. Long: Continue exercise to improve strength and stamina. Short: Return to rehab. Long: Graduate.        Discharge Exercise Prescription (Final Exercise Prescription Changes):  Exercise Prescription Changes - 02/09/24 1400       Response to Exercise   Blood Pressure (Admit) 126/72    Blood Pressure (Exercise) 160/80    Blood Pressure (Exit) 118/72    Heart Rate (Admit) 75 bpm    Heart Rate (Exercise) 132 bpm    Heart Rate (Exit) 74 bpm    Rating of Perceived Exertion (Exercise) 12    Symptoms none    Comments First two days of exercise    Duration Continue with 30 min of aerobic exercise without signs/symptoms of physical distress.    Intensity THRR unchanged      Progression   Progression Continue to progress workloads to maintain intensity without signs/symptoms of physical distress.    Average METs 4.23      Resistance Training   Training Prescription Yes    Weight 10 lb    Reps 10-15      Interval Training   Interval Training No      Treadmill   MPH 3    Grade 1    Minutes 15    METs 3.71      Elliptical   Level 5    Speed 3.3    Minutes 15    METs 5.1      Oxygen   Maintain Oxygen Saturation 88%  or higher          Nutrition:  Target Goals: Understanding of nutrition guidelines, daily intake of sodium 1500mg , cholesterol 200mg , calories 30% from fat and 7% or less from saturated fats, daily to have 5 or more servings of fruits and vegetables.  Education: Nutrition 1 -Group instruction provided by verbal, written material, interactive activities, discussions, models, and posters to present general guidelines for heart healthy nutrition including macronutrients, label reading, and promoting whole foods over processed counterparts. Education serves as Pensions consultant of discussion of heart healthy eating for all. Written material provided at class time.    Education: Nutrition 2 -Group instruction provided by verbal, written material, interactive activities, discussions, models, and posters to present general guidelines for heart healthy nutrition including sodium, cholesterol, and saturated fat. Providing guidance of habit forming to improve blood pressure, cholesterol, and body weight. Written material provided at class time.     Biometrics:  Pre Biometrics - 01/13/24 1526       Pre Biometrics   Height 5' 6.8 (1.697 m)    Weight 221 lb (100.2 kg)    Waist Circumference 40 inches    Hip Circumference 43 inches    Waist to Hip Ratio 0.93 %    BMI (Calculated) 34.81    Single Leg Stand 30 seconds           Nutrition Therapy Plan and Nutrition Goals:   Nutrition Assessments:  MEDIFICTS Score Key: >=70 Need to make dietary changes  40-70 Heart Healthy Diet <= 40 Therapeutic Level Cholesterol Diet  Flowsheet Row Cardiac Rehab from 01/13/2024 in [facility] Cardiac and Pulmonary Rehab  Picture Your Plate Total Score on Admission 66   Picture Your Plate Scores: <59 Unhealthy dietary pattern with much room for improvement. 41-50 Dietary pattern unlikely to meet recommendations for good health and room for improvement. 51-60 More healthful dietary pattern,  with some room for  improvement.  >60 Healthy dietary pattern, although there may be some specific behaviors that could be improved.    Nutrition Goals Re-Evaluation:   Nutrition Goals Discharge (Final Nutrition Goals Re-Evaluation):   Psychosocial: Target Goals: Acknowledge presence or absence of significant depression and/or stress, maximize coping skills, provide positive support system. Participant is able to verbalize types and ability to use techniques and skills needed for reducing stress and depression.   Education: Stress, Anxiety, and Depression - Group verbal and visual presentation to define topics covered.  Reviews how body is impacted by stress, anxiety, and depression.  Also discusses healthy ways to reduce stress and to treat/manage anxiety and depression. Written material provided at class time.   Education: Sleep Hygiene -Provides group verbal and written instruction about how sleep can affect your health.  Define sleep hygiene, discuss sleep cycles and impact of sleep habits. Review good sleep hygiene tips.   Initial Review & Psychosocial Screening:  Initial Psych Review & Screening - 01/07/24 1022       Initial Review   Current issues with Current Stress Concerns    Source of Stress Concerns Financial      Family Dynamics   Good Support System? Yes      Barriers   Psychosocial barriers to participate in program There are no identifiable barriers or psychosocial needs.;The patient should benefit from training in stress management and relaxation.      Screening Interventions   Interventions To provide support and resources with identified psychosocial needs;Encouraged to exercise;Provide feedback about the scores to participant    Expected Outcomes Short Term goal: Utilizing psychosocial counselor, staff and physician to assist with identification of specific Stressors or current issues interfering with healing process. Setting desired goal for each stressor or current issue  identified.;Long Term Goal: Stressors or current issues are controlled or eliminated.;Long Term goal: The participant improves quality of Life and PHQ9 Scores as seen by post scores and/or verbalization of changes;Short Term goal: Identification and review with participant of any Quality of Life or Depression concerns found by scoring the questionnaire.          Quality of Life Scores:   Quality of Life - 01/13/24 1527       Quality of Life   Select Quality of Life      Quality of Life Scores   Health/Function Pre 13.27 %    Socioeconomic Pre 11.56 %    Psych/Spiritual Pre 16.43 %    Family Pre 27.6 %    GLOBAL Pre 15.56 %         Scores of 19 and below usually indicate a poorer quality of life in these areas.  A difference of  2-3 points is a clinically meaningful difference.  A difference of 2-3 points in the total score of the Quality of Life Index has been associated with significant improvement in overall quality of life, self-image, physical symptoms, and general health in studies assessing change in quality of life.  PHQ-9: Review Flowsheet       01/13/2024  Depression screen PHQ 2/9  Decreased Interest 0  Down, Depressed, Hopeless 0  PHQ - 2 Score 0  Altered sleeping 2  Tired, decreased energy 1  Change in appetite 0  Feeling bad or failure about yourself  0  Trouble concentrating 0  Moving slowly or fidgety/restless 0  Suicidal thoughts 0  PHQ-9 Score 3  Difficult doing work/chores Not difficult at all   Interpretation of  Total Score  Total Score Depression Severity:  1-4 = Minimal depression, 5-9 = Mild depression, 10-14 = Moderate depression, 15-19 = Moderately severe depression, 20-27 = Severe depression   Psychosocial Evaluation and Intervention:  Psychosocial Evaluation - 01/07/24 1027       Psychosocial Evaluation & Interventions   Interventions Encouraged to exercise with the program and follow exercise prescription;Stress management  education;Relaxation education    Comments Pistol is coming to cardiac rehab after a stent placement. He has returned to work in Aeronautical engineer and has noticed how he gets tired easier. 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 He mentions feeling like he isn't able to keep up like he used to and doesn't know if this is his new normal. He is ready to get started in the program to learn more about heart healthy living and eating.    Expected Outcomes Short: attend cardiac rehab for education and exercise Long: develop and maintain positive self care habits.    Continue Psychosocial Services  Follow up required by staff          Psychosocial Re-Evaluation:   Psychosocial Discharge (Final Psychosocial Re-Evaluation):   Vocational Rehabilitation: Provide vocational rehab assistance to qualifying candidates.   Vocational Rehab Evaluation & Intervention:  Vocational Rehab - 01/07/24 1022       Initial Vocational Rehab Evaluation & Intervention   Assessment shows need for Vocational Rehabilitation No          Education: Education Goals: Education classes will be provided on a variety of topics geared toward better understanding of heart health and risk factor modification. Participant will state understanding/return demonstration of topics presented as noted by education test scores.  Learning Barriers/Preferences:  Learning Barriers/Preferences - 01/07/24 1022       Learning Barriers/Preferences   Learning Barriers None    Learning Preferences None          General Cardiac Education Topics:  AED/CPR: - Group verbal and written instruction with the use of models to demonstrate the basic use of the AED with the basic ABC's of resuscitation.   Test and Procedures: - Group verbal and visual presentation and models provide information about  basic cardiac anatomy and function. Reviews the testing methods done to diagnose heart disease and the outcomes of the test results. Describes the treatment choices: Medical Management, Angioplasty, or Coronary Bypass Surgery for treating various heart conditions including Myocardial Infarction, Angina, Valve Disease, and Cardiac Arrhythmias. Written material provided at class time.   Medication Safety: - Group verbal and visual instruction to review commonly prescribed medications for heart and lung disease. Reviews the medication, class of the drug, and side effects. Includes the steps to properly store meds and maintain the prescription regimen. Written material provided at class time.   Intimacy: - Group verbal instruction through game format to discuss how heart and lung disease can affect sexual intimacy. Written material provided at class time.   Know Your Numbers and Heart Failure: - Group verbal and visual instruction to discuss disease risk factors for cardiac and pulmonary disease and treatment options.  Reviews associated critical values for Overweight/Obesity, Hypertension, Cholesterol, and Diabetes.  Discusses basics of heart failure: signs/symptoms and treatments.  Introduces Heart Failure Zone chart for action plan for heart failure. Written material provided at class time.   Infection Prevention: - Provides  verbal and written material to individual with discussion of infection control including proper hand washing and proper equipment cleaning during exercise session. Flowsheet Row Cardiac Rehab from 01/13/2024 in [facility] Cardiac and Pulmonary Rehab  Date 01/13/24  Educator [facility]  Instruction Review Code 1- Verbalizes Understanding    Falls Prevention: - Provides verbal and written material to individual with discussion of falls prevention and safety. Flowsheet Row Cardiac Rehab from 01/13/2024 in [facility] Cardiac and Pulmonary Rehab  Date 01/13/24  Educator [facility]  Instruction Review Code  1- Verbalizes Understanding    Other: -Provides group and verbal instruction on various topics (see comments)   Knowledge Questionnaire Score:  Knowledge Questionnaire Score - 01/13/24 1528       Knowledge Questionnaire Score   Pre Score 25/26          Core Components/Risk Factors/Patient Goals at Admission:  Personal Goals and Risk Factors at Admission - 01/27/24 1555       Core Components/Risk Factors/Patient Goals on Admission    Weight Management Yes    Intervention Weight Management: Develop a combined nutrition and exercise program designed to reach desired caloric intake, while maintaining appropriate intake of nutrient and fiber, sodium and fats, and appropriate energy expenditure required for the weight goal.;Weight Management: Provide education and appropriate resources to help participant work on and attain dietary goals.;Weight Management/Obesity: Establish reasonable short term and long term weight goals.;Obesity: Provide education and appropriate resources to help participant work on and attain dietary goals.    Admit Weight 220 lb (99.8 kg)    Goal Weight: Short Term 215 lb (97.5 kg)    Goal Weight: Long Term 190 lb (86.2 kg)    Expected Outcomes Short Term: Continue to assess and modify interventions until short term weight is achieved;Long Term: Adherence to nutrition and physical activity/exercise program aimed toward attainment of established weight goal;Weight Loss: Understanding of general recommendations for a balanced deficit meal plan, which promotes 1-2 lb weight loss per week and includes a negative energy balance of [phone] kcal/d;Understanding recommendations for meals to include 15-35% energy as protein, 25-35% energy from fat, 35-60% energy from carbohydrates, less than 200mg  of dietary cholesterol, 20-35 gm of total fiber daily;Understanding of distribution of calorie intake throughout the day with the consumption of 4-5 meals/snacks    Tobacco Cessation  Yes    Number of packs per day vaping    Intervention Assist the participant in steps to quit. Provide individualized education and counseling about committing to Tobacco Cessation, relapse prevention, and pharmacological support that can be provided by physician.;Education officer, environmental, assist with locating and accessing local/national Quit Smoking programs, and support quit date choice.    Expected Outcomes Short Term: Will demonstrate readiness to quit, by selecting a quit date.;Short Term: Will quit all tobacco product use, adhering to prevention of relapse plan.;Long Term: Complete abstinence from all tobacco products for at least 12 months from quit date.    Hypertension Yes    Intervention Provide education on lifestyle modifcations including regular physical activity/exercise, weight management, moderate sodium restriction and increased consumption of fresh fruit, vegetables, and low fat dairy, alcohol moderation, and smoking cessation.;Monitor prescription use compliance.    Expected Outcomes Short Term: Continued assessment and intervention until BP is < 140/63mm HG in hypertensive participants. < 130/16mm HG in hypertensive participants with diabetes, heart failure or chronic kidney disease.;Long Term: Maintenance of blood pressure at goal levels.    Lipids Yes    Intervention Provide education and support for participant on  nutrition & aerobic/resistive exercise along with prescribed medications to achieve LDL 70mg , HDL >40mg .    Expected Outcomes Short Term: Participant states understanding of desired cholesterol values and is compliant with medications prescribed. Participant is following exercise prescription and nutrition guidelines.;Long Term: Cholesterol controlled with medications as prescribed, with individualized exercise RX and with personalized nutrition plan. Value goals: LDL < 70mg , HDL > 40 mg.          Education:Diabetes - Individual verbal and written instruction  to review signs/symptoms of diabetes, desired ranges of glucose level fasting, after meals and with exercise. Acknowledge that pre and post exercise glucose checks will be done for 3 sessions at entry of program.   Core Components/Risk Factors/Patient Goals Review:    Core Components/Risk Factors/Patient Goals at Discharge (Final Review):    ITP Comments:  ITP Comments     Row Name 01/07/24 1012 01/13/24 1519 01/27/24 1547 02/03/24 0758 03/02/24 0957   ITP Comments Initial phone call completed. Diagnosis can be found in [facility] 7/21. EP Orientation scheduled for Wednesday 8/6 at 1:45. Completed and gym orientation for cardiac rehab. Initial ITP created and sent for review to Dr. Oneil Pinal, Medical Director. First full day of exercise!  Patient was oriented to gym and equipment including functions, settings, policies, and procedures.  Patient's individual exercise prescription and treatment plan were reviewed.  All starting workloads were established based on the results of the 6 minute walk test done at initial orientation visit.  The plan for exercise progression was also introduced and progression will be customized based on patient's performance and goals. 30 Day review completed. Medical Director ITP review done; changes made as directed and signed approval by Medical Director. New to program. 30 Day review completed. Medical Director ITP review done, changes made as directed, and signed approval by Medical Director.      Comments: 30 day review

## 2024-03-07 ENCOUNTER — Telehealth: Payer: Self-pay | Admitting: Emergency Medicine

## 2024-03-07 ENCOUNTER — Encounter

## 2024-03-07 NOTE — Telephone Encounter (Signed)
 Spoke with Leroy Hill regarding his continuing cardiac rehab. Patient states that he does wish to continue, however he has been working very long hours and has not had time to attend sessions. States that he plans to return at his normal time on Monday, 10/6.

## 2024-03-09 ENCOUNTER — Encounter

## 2024-03-14 ENCOUNTER — Ambulatory Visit

## 2024-03-14 ENCOUNTER — Encounter

## 2024-03-16 ENCOUNTER — Ambulatory Visit

## 2024-03-16 ENCOUNTER — Encounter

## 2024-03-17 ENCOUNTER — Ambulatory Visit

## 2024-03-21 ENCOUNTER — Encounter

## 2024-03-21 ENCOUNTER — Ambulatory Visit

## 2024-03-23 ENCOUNTER — Ambulatory Visit

## 2024-03-23 ENCOUNTER — Encounter

## 2024-03-23 DIAGNOSIS — Z955 Presence of coronary angioplasty implant and graft: Secondary | ICD-10-CM

## 2024-03-23 NOTE — Progress Notes (Signed)
 Early Discharge Summary  Leroy Hill 10-31-1985  Leroy Hill is discharging early due to lack of attendance. He completed 3 of 36 sessions.    6 Minute Walk     Row Name 01/13/24 1519         6 Minute Walk   Phase Initial     Distance 1570 feet     Walk Time 6 minutes     # of Rest Breaks 0     MPH 2.97     METS 4.77     RPE 7     Perceived Dyspnea  0     VO2 Peak 16.7     Symptoms No     Resting HR 69 bpm     Resting BP 116/64     Resting Oxygen Saturation  98 %     Exercise Oxygen Saturation  during 6 min walk 98 %     Max Ex. HR 106 bpm     Max Ex. BP 134/66     2 Minute Post BP 126/68

## 2024-03-23 NOTE — Progress Notes (Signed)
 Cardiac Individual Treatment Plan  Patient Details  Name: ZIMERE DUNLEVY MRN: [id number] Date of Birth: [birth year] Referring Provider:   Flowsheet Row Cardiac Rehab from 01/13/2024 in [facility] Cardiac and Pulmonary Rehab  Referring Provider Dr. Zachary Car    Initial Encounter Date:  Flowsheet Row Cardiac Rehab from 01/13/2024 in [facility] Cardiac and Pulmonary Rehab  Date 01/13/24    Visit Diagnosis: Status post coronary artery stent placement  Patient's Home Medications on Admission:  Current Outpatient Medications:    aspirin 81 MG chewable tablet, Chew 81 mg by mouth., Disp: , Rfl:    atorvastatin (LIPITOR) 80 MG tablet, Take 80 mg by mouth daily., Disp: , Rfl:    EPINEPHrine 0.3 mg/0.3 mL IJ SOAJ injection, Inject into the muscle., Disp: , Rfl:    ezetimibe (ZETIA) 10 MG tablet, Take 10 mg by mouth daily., Disp: , Rfl:    losartan (COZAAR) 50 MG tablet, Take 50 mg by mouth., Disp: , Rfl:    nitroGLYCERIN (NITROSTAT) 0.4 MG SL tablet, Place 0.4 mg under the tongue., Disp: , Rfl:    ticagrelor (BRILINTA) 90 MG TABS tablet, Take 90 mg by mouth., Disp: , Rfl:   Past Medical History: Past Medical History:  Diagnosis Date   Hypertension     Tobacco Use: Social History   Tobacco Use  Smoking Status Some Days  Smokeless Tobacco Current    Labs: Review Flowsheet        No data to display           Exercise Target Goals: Exercise Program Goal: Individual exercise prescription set using results from initial 6 min walk test and THRR while considering  patient's activity barriers and safety.   Exercise Prescription Goal: Initial exercise prescription builds to 30-45 minutes a day of aerobic activity, 2-3 days per week.  Home exercise guidelines will be given to patient during program as part of exercise prescription that the participant will acknowledge.   Education: Aerobic Exercise: - Group verbal and visual presentation on the components of exercise prescription.  Introduces F.I.T.T principle from ACSM for exercise prescriptions.  Reviews F.I.T.T. principles of aerobic exercise including progression. Written material provided at class time.   Education: Resistance Exercise: - Group verbal and visual presentation on the components of exercise prescription. Introduces F.I.T.T principle from ACSM for exercise prescriptions  Reviews F.I.T.T. principles of resistance exercise including progression. Written material provided at class time.    Education: Exercise & Equipment Safety: - Individual verbal instruction and demonstration of equipment use and safety with use of the equipment. Flowsheet Row Cardiac Rehab from 01/13/2024 in [facility] Cardiac and Pulmonary Rehab  Date 01/13/24  Educator [facility]  Instruction Review Code 1- Verbalizes Understanding    Education: Exercise Physiology & General Exercise Guidelines: - Group verbal and written instruction with models to review the exercise physiology of the cardiovascular system and associated critical values. Provides general exercise guidelines with specific guidelines to those with heart or lung disease. Written material provided at class time.   Education: Flexibility, Balance, Mind/Body Relaxation: - Group verbal and visual presentation with interactive activity on the components of exercise prescription. Introduces F.I.T.T principle from ACSM for exercise prescriptions. Reviews F.I.T.T. principles of flexibility and balance exercise training including progression. Also discusses the mind body connection.  Reviews various relaxation techniques to help reduce and manage stress (i.e. Deep breathing, progressive muscle relaxation, and visualization). Balance handout provided to take home. Written material provided at class time.   Activity Barriers & Risk Stratification:  Activity Barriers & Cardiac Risk Stratification - 01/13/24 1522       Activity Barriers & Cardiac Risk Stratification   Activity Barriers None     Cardiac Risk Stratification Moderate          6 Minute Walk:  6 Minute Walk     Row Name 01/13/24 1519         6 Minute Walk   Phase Initial     Distance 1570 feet     Walk Time 6 minutes     # of Rest Breaks 0     MPH 2.97     METS 4.77     RPE 7     Perceived Dyspnea  0     VO2 Peak 16.7     Symptoms No     Resting HR 69 bpm     Resting BP 116/64     Resting Oxygen Saturation  98 %     Exercise Oxygen Saturation  during 6 min walk 98 %     Max Ex. HR 106 bpm     Max Ex. BP 134/66     2 Minute Post BP 126/68        Oxygen Initial Assessment:   Oxygen Re-Evaluation:   Oxygen Discharge (Final Oxygen Re-Evaluation):   Initial Exercise Prescription:  Initial Exercise Prescription - 01/13/24 1500       Date of Initial Exercise RX and Referring Provider   Date 01/13/24    Referring Provider Dr. Zachary Car      Oxygen   Maintain Oxygen Saturation 88% or higher      Treadmill   MPH 3    Grade 1    Minutes 15    METs 3.71      NuStep   Level 3    SPM 80    Minutes 15    METs 4.77      Elliptical   Level 1    Speed 3    Minutes 15    METs 4.77      REL-XR   Level 3    Watts 25    Speed 50    Minutes 15    METs 4.77      Rower   Level 5    Watts 25    Minutes 15    METs 4.77      Prescription Details   Duration Progress to 30 minutes of continuous aerobic without signs/symptoms of physical distress      Intensity   THRR 40-80% of Max Heartrate 114-159    Ratings of Perceived Exertion 11-13    Perceived Dyspnea 0-4      Progression   Progression Continue to progress workloads to maintain intensity without signs/symptoms of physical distress.      Resistance Training   Training Prescription Yes    Weight 10lb    Reps 10-15          Perform Capillary Blood Glucose checks as needed.  Exercise Prescription Changes:   Exercise Prescription Changes     Row Name 01/13/24 1500 02/09/24 1400           Response to  Exercise   Blood Pressure (Admit) 116/64 126/72      Blood Pressure (Exercise) 134/66 160/80      Blood Pressure (Exit) 126/68 118/72      Heart Rate (Admit) 69 bpm 75 bpm      Heart Rate (Exercise) 106 bpm 132 bpm  Heart Rate (Exit) 77 bpm 74 bpm      Oxygen Saturation (Admit) 98 % --      Oxygen Saturation (Exercise) 98 % --      Oxygen Saturation (Exit) 98 % --      Rating of Perceived Exertion (Exercise) 7 12      Perceived Dyspnea (Exercise) 0 --      Symptoms none none      Comments results First two days of exercise      Duration -- Continue with 30 min of aerobic exercise without signs/symptoms of physical distress.      Intensity -- THRR unchanged        Progression   Progression -- Continue to progress workloads to maintain intensity without signs/symptoms of physical distress.      Average METs -- 4.23        Resistance Training   Training Prescription -- Yes      Weight -- 10 lb      Reps -- 10-15        Interval Training   Interval Training -- No        Treadmill   MPH -- 3      Grade -- 1      Minutes -- 15      METs -- 3.71        Elliptical   Level -- 5      Speed -- 3.3      Minutes -- 15      METs -- 5.1        Oxygen   Maintain Oxygen Saturation -- 88% or higher         Exercise Comments:   Exercise Comments     Row Name 01/27/24 1547           Exercise Comments First full day of exercise!  Patient was oriented to gym and equipment including functions, settings, policies, and procedures.  Patient's individual exercise prescription and treatment plan were reviewed.  All starting workloads were established based on the results of the 6 minute walk test done at initial orientation visit.  The plan for exercise progression was also introduced and progression will be customized based on patient's performance and goals.          Exercise Goals and Review:   Exercise Goals     Row Name 01/13/24 1525             Exercise Goals    Increase Physical Activity Yes       Intervention Provide advice, education, support and counseling about physical activity/exercise needs.;Develop an individualized exercise prescription for aerobic and resistive training based on initial evaluation findings, risk stratification, comorbidities and participant's personal goals.       Expected Outcomes Short Term: Attend rehab on a regular basis to increase amount of physical activity.;Long Term: Add in home exercise to make exercise part of routine and to increase amount of physical activity.;Long Term: Exercising regularly at least 3-5 days a week.       Increase Strength and Stamina Yes       Intervention Provide advice, education, support and counseling about physical activity/exercise needs.;Develop an individualized exercise prescription for aerobic and resistive training based on initial evaluation findings, risk stratification, comorbidities and participant's personal goals.       Expected Outcomes Short Term: Increase workloads from initial exercise prescription for resistance, speed, and METs.;Short Term: Perform resistance training exercises routinely during rehab and add in  resistance training at home;Long Term: Improve cardiorespiratory fitness, muscular endurance and strength as measured by increased METs and functional capacity ( )       Able to understand and use rate of perceived exertion (RPE) scale Yes       Intervention Provide education and explanation on how to use RPE scale       Expected Outcomes Short Term: Able to use RPE daily in rehab to express subjective intensity level;Long Term:  Able to use RPE to guide intensity level when exercising independently       Able to understand and use Dyspnea scale Yes       Intervention Provide education and explanation on how to use Dyspnea scale       Expected Outcomes Short Term: Able to use Dyspnea scale daily in rehab to express subjective sense of shortness of breath during  exertion;Long Term: Able to use Dyspnea scale to guide intensity level when exercising independently       Knowledge and understanding of Target Heart Rate Range (THRR) Yes       Intervention Provide education and explanation of THRR including how the numbers were predicted and where they are located for reference       Expected Outcomes Short Term: Able to state/look up THRR;Short Term: Able to use daily as guideline for intensity in rehab;Long Term: Able to use THRR to govern intensity when exercising independently       Able to check pulse independently Yes       Intervention Provide education and demonstration on how to check pulse in carotid and radial arteries.;Review the importance of being able to check your own pulse for safety during independent exercise       Expected Outcomes Short Term: Able to explain why pulse checking is important during independent exercise;Long Term: Able to check pulse independently and accurately       Understanding of Exercise Prescription Yes       Intervention Provide education, explanation, and written materials on patient's individual exercise prescription       Expected Outcomes Short Term: Able to explain program exercise prescription;Long Term: Able to explain home exercise prescription to exercise independently          Exercise Goals Re-Evaluation :  Exercise Goals Re-Evaluation     Row Name 01/27/24 1547 02/09/24 1409 02/25/24 1357 03/07/24 1608       Exercise Goal Re-Evaluation   Exercise Goals Review Increase Physical Activity;Able to understand and use rate of perceived exertion (RPE) scale;Knowledge and understanding of Target Heart Rate Range (THRR);Understanding of Exercise Prescription;Able to understand and use Dyspnea scale;Able to check pulse independently;Increase Strength and Stamina Increase Physical Activity;Increase Strength and Stamina;Understanding of Exercise Prescription Increase Physical Activity;Increase Strength and  Stamina;Understanding of Exercise Prescription Increase Physical Activity;Increase Strength and Stamina;Understanding of Exercise Prescription    Comments Reviewed RPE and dyspnea scale, THR and program prescription with pt today.  Pt voiced understanding and was given a copy of goals to take home. Medford is off to a good start in the program. He did well on the treadmill at a speed of 3 mph with an incline of 1%. He also did well working on the elliptical at level 5. We will continue to monitor his progress in the program. Medford has not attended rehab since the last review. We attempted to call him but he did not answer and his voicemail was not set up. We will continue to monitor his progress when he returns to  the program. Medford has not attended rehab since the last review. We recently called him and he stated that he would like to attend the program, but has been unable to due to work. He states that he plans to return to rehab on 03/14/2024. We will continue to monitor his progress when he returns to the program.    Expected Outcomes Short: Use RPE daily to regulate intensity. Long: Follow program prescription in THR. Short: Continue to follow current exercise prescription. Long: Continue exercise to improve strength and stamina. Short: Return to rehab. Long: Graduate. Short: Return to rehab. Long: Graduate.       Discharge Exercise Prescription (Final Exercise Prescription Changes):  Exercise Prescription Changes - 02/09/24 1400       Response to Exercise   Blood Pressure (Admit) 126/72    Blood Pressure (Exercise) 160/80    Blood Pressure (Exit) 118/72    Heart Rate (Admit) 75 bpm    Heart Rate (Exercise) 132 bpm    Heart Rate (Exit) 74 bpm    Rating of Perceived Exertion (Exercise) 12    Symptoms none    Comments First two days of exercise    Duration Continue with 30 min of aerobic exercise without signs/symptoms of physical distress.    Intensity THRR unchanged      Progression    Progression Continue to progress workloads to maintain intensity without signs/symptoms of physical distress.    Average METs 4.23      Resistance Training   Training Prescription Yes    Weight 10 lb    Reps 10-15      Interval Training   Interval Training No      Treadmill   MPH 3    Grade 1    Minutes 15    METs 3.71      Elliptical   Level 5    Speed 3.3    Minutes 15    METs 5.1      Oxygen   Maintain Oxygen Saturation 88% or higher          Nutrition:  Target Goals: Understanding of nutrition guidelines, daily intake of sodium 1500mg , cholesterol 200mg , calories 30% from fat and 7% or less from saturated fats, daily to have 5 or more servings of fruits and vegetables.  Education: Nutrition 1 -Group instruction provided by verbal, written material, interactive activities, discussions, models, and posters to present general guidelines for heart healthy nutrition including macronutrients, label reading, and promoting whole foods over processed counterparts. Education serves as Pensions consultant of discussion of heart healthy eating for all. Written material provided at class time.    Education: Nutrition 2 -Group instruction provided by verbal, written material, interactive activities, discussions, models, and posters to present general guidelines for heart healthy nutrition including sodium, cholesterol, and saturated fat. Providing guidance of habit forming to improve blood pressure, cholesterol, and body weight. Written material provided at class time.     Biometrics:  Pre Biometrics - 01/13/24 1526       Pre Biometrics   Height 5' 6.8 (1.697 m)    Weight 221 lb (100.2 kg)    Waist Circumference 40 inches    Hip Circumference 43 inches    Waist to Hip Ratio 0.93 %    BMI (Calculated) 34.81    Single Leg Stand 30 seconds           Nutrition Therapy Plan and Nutrition Goals:   Nutrition Assessments:  MEDIFICTS Score Key: >=70 Need to make dietary  changes  40-70 Heart Healthy Diet <= 40 Therapeutic Level Cholesterol Diet  Flowsheet Row Cardiac Rehab from 01/13/2024 in [facility] Cardiac and Pulmonary Rehab  Picture Your Plate Total Score on Admission 66   Picture Your Plate Scores: <59 Unhealthy dietary pattern with much room for improvement. 41-50 Dietary pattern unlikely to meet recommendations for good health and room for improvement. 51-60 More healthful dietary pattern, with some room for improvement.  >60 Healthy dietary pattern, although there may be some specific behaviors that could be improved.    Nutrition Goals Re-Evaluation:   Nutrition Goals Discharge (Final Nutrition Goals Re-Evaluation):   Psychosocial: Target Goals: Acknowledge presence or absence of significant depression and/or stress, maximize coping skills, provide positive support system. Participant is able to verbalize types and ability to use techniques and skills needed for reducing stress and depression.   Education: Stress, Anxiety, and Depression - Group verbal and visual presentation to define topics covered.  Reviews how body is impacted by stress, anxiety, and depression.  Also discusses healthy ways to reduce stress and to treat/manage anxiety and depression. Written material provided at class time.   Education: Sleep Hygiene -Provides group verbal and written instruction about how sleep can affect your health.  Define sleep hygiene, discuss sleep cycles and impact of sleep habits. Review good sleep hygiene tips.   Initial Review & Psychosocial Screening:  Initial Psych Review & Screening - 01/07/24 1022       Initial Review   Current issues with Current Stress Concerns    Source of Stress Concerns Financial      Family Dynamics   Good Support System? Yes      Barriers   Psychosocial barriers to participate in program There are no identifiable barriers or psychosocial needs.;The patient should benefit from training in stress management and  relaxation.      Screening Interventions   Interventions To provide support and resources with identified psychosocial needs;Encouraged to exercise;Provide feedback about the scores to participant    Expected Outcomes Short Term goal: Utilizing psychosocial counselor, staff and physician to assist with identification of specific Stressors or current issues interfering with healing process. Setting desired goal for each stressor or current issue identified.;Long Term Goal: Stressors or current issues are controlled or eliminated.;Long Term goal: The participant improves quality of Life and PHQ9 Scores as seen by post scores and/or verbalization of changes;Short Term goal: Identification and review with participant of any Quality of Life or Depression concerns found by scoring the questionnaire.          Quality of Life Scores:   Quality of Life - 01/13/24 1527       Quality of Life   Select Quality of Life      Quality of Life Scores   Health/Function Pre 13.27 %    Socioeconomic Pre 11.56 %    Psych/Spiritual Pre 16.43 %    Family Pre 27.6 %    GLOBAL Pre 15.56 %         Scores of 19 and below usually indicate a poorer quality of life in these areas.  A difference of  2-3 points is a clinically meaningful difference.  A difference of 2-3 points in the total score of the Quality of Life Index has been associated with significant improvement in overall quality of life, self-image, physical symptoms, and general health in studies assessing change in quality of life.  PHQ-9: Review Flowsheet       01/13/2024  Depression screen [facility] 2/9  Decreased Interest 0  Down, Depressed, Hopeless 0  PHQ - 2 Score 0  Altered sleeping 2  Tired, decreased energy 1  Change in appetite 0  Feeling bad or failure about yourself  0  Trouble concentrating 0  Moving slowly or fidgety/restless 0  Suicidal thoughts 0  PHQ-9 Score 3  Difficult doing work/chores Not difficult at all   Interpretation of  Total Score  Total Score Depression Severity:  1-4 = Minimal depression, 5-9 = Mild depression, 10-14 = Moderate depression, 15-19 = Moderately severe depression, 20-27 = Severe depression   Psychosocial Evaluation and Intervention:  Psychosocial Evaluation - 01/07/24 1027       Psychosocial Evaluation & Interventions   Interventions Encouraged to exercise with the program and follow exercise prescription;Stress management education;Relaxation education    Comments Jeramey is coming to cardiac rehab after a stent placement. He has returned to work in Aeronautical engineer and has noticed how he gets tired easier. 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 He mentions feeling like he isn't able to keep up like he used to and doesn't know if this is his new normal. He is ready to get started in the program to learn more about heart healthy living and eating.    Expected Outcomes Short: attend cardiac rehab for education and exercise Long: develop and maintain positive self care habits.    Continue Psychosocial Services  Follow up required by staff          Psychosocial Re-Evaluation:   Psychosocial Discharge (Final Psychosocial Re-Evaluation):   Vocational Rehabilitation: Provide vocational rehab assistance to qualifying candidates.   Vocational Rehab Evaluation & Intervention:  Vocational Rehab - 01/07/24 1022       Initial Vocational Rehab Evaluation & Intervention   Assessment shows need for Vocational Rehabilitation No          Education: Education Goals: Education classes will be provided on a variety of topics geared toward better understanding of heart health and risk factor modification. Participant will state understanding/return demonstration of topics presented as noted by education test scores.  Learning Barriers/Preferences:  Learning  Barriers/Preferences - 01/07/24 1022       Learning Barriers/Preferences   Learning Barriers None    Learning Preferences None          General Cardiac Education Topics:  AED/CPR: - Group verbal and written instruction with the use of models to demonstrate the basic use of the AED with the basic ABC's of resuscitation.   Test and Procedures: - Group verbal and visual presentation and models provide information about basic cardiac anatomy and function. Reviews the testing methods done to diagnose heart disease and the outcomes of the test results. Describes the treatment choices: Medical Management, Angioplasty, or Coronary Bypass Surgery for treating various heart conditions including Myocardial Infarction, Angina, Valve Disease, and Cardiac Arrhythmias. Written material provided at class time.   Medication Safety: - Group verbal and visual instruction to review commonly prescribed medications for heart and lung disease. Reviews the medication, class of the drug, and side effects. Includes the steps to properly store meds and maintain the prescription regimen. Written material provided at class time.   Intimacy: - Group verbal instruction through game format to discuss how heart and lung disease can affect sexual intimacy. Written material provided at class time.   Know Your  Numbers and Heart Failure: - Group verbal and visual instruction to discuss disease risk factors for cardiac and pulmonary disease and treatment options.  Reviews associated critical values for Overweight/Obesity, Hypertension, Cholesterol, and Diabetes.  Discusses basics of heart failure: signs/symptoms and treatments.  Introduces Heart Failure Zone chart for action plan for heart failure. Written material provided at class time.   Infection Prevention: - Provides verbal and written material to individual with discussion of infection control including proper hand washing and proper equipment cleaning during  exercise session. Flowsheet Row Cardiac Rehab from 01/13/2024 in [facility] Cardiac and Pulmonary Rehab  Date 01/13/24  Educator [facility]  Instruction Review Code 1- Verbalizes Understanding    Falls Prevention: - Provides verbal and written material to individual with discussion of falls prevention and safety. Flowsheet Row Cardiac Rehab from 01/13/2024 in [facility] Cardiac and Pulmonary Rehab  Date 01/13/24  Educator [facility]  Instruction Review Code 1- Verbalizes Understanding    Other: -Provides group and verbal instruction on various topics (see comments)   Knowledge Questionnaire Score:  Knowledge Questionnaire Score - 01/13/24 1528       Knowledge Questionnaire Score   Pre Score 25/26          Core Components/Risk Factors/Patient Goals at Admission:  Personal Goals and Risk Factors at Admission - 01/27/24 1555       Core Components/Risk Factors/Patient Goals on Admission    Weight Management Yes    Intervention Weight Management: Develop a combined nutrition and exercise program designed to reach desired caloric intake, while maintaining appropriate intake of nutrient and fiber, sodium and fats, and appropriate energy expenditure required for the weight goal.;Weight Management: Provide education and appropriate resources to help participant work on and attain dietary goals.;Weight Management/Obesity: Establish reasonable short term and long term weight goals.;Obesity: Provide education and appropriate resources to help participant work on and attain dietary goals.    Admit Weight 220 lb (99.8 kg)    Goal Weight: Short Term 215 lb (97.5 kg)    Goal Weight: Long Term 190 lb (86.2 kg)    Expected Outcomes Short Term: Continue to assess and modify interventions until short term weight is achieved;Long Term: Adherence to nutrition and physical activity/exercise program aimed toward attainment of established weight goal;Weight Loss: Understanding of general recommendations for a balanced deficit meal  plan, which promotes 1-2 lb weight loss per week and includes a negative energy balance of [phone] kcal/d;Understanding recommendations for meals to include 15-35% energy as protein, 25-35% energy from fat, 35-60% energy from carbohydrates, less than 200mg  of dietary cholesterol, 20-35 gm of total fiber daily;Understanding of distribution of calorie intake throughout the day with the consumption of 4-5 meals/snacks    Tobacco Cessation Yes    Number of packs per day vaping    Intervention Assist the participant in steps to quit. Provide individualized education and counseling about committing to Tobacco Cessation, relapse prevention, and pharmacological support that can be provided by physician.;Education officer, environmental, assist with locating and accessing local/national Quit Smoking programs, and support quit date choice.    Expected Outcomes Short Term: Will demonstrate readiness to quit, by selecting a quit date.;Short Term: Will quit all tobacco product use, adhering to prevention of relapse plan.;Long Term: Complete abstinence from all tobacco products for at least 12 months from quit date.    Hypertension Yes    Intervention Provide education on lifestyle modifcations including regular physical activity/exercise, weight management, moderate sodium restriction and increased consumption of fresh fruit, vegetables, and low fat  dairy, alcohol moderation, and smoking cessation.;Monitor prescription use compliance.    Expected Outcomes Short Term: Continued assessment and intervention until BP is < 140/5mm HG in hypertensive participants. < 130/31mm HG in hypertensive participants with diabetes, heart failure or chronic kidney disease.;Long Term: Maintenance of blood pressure at goal levels.    Lipids Yes    Intervention Provide education and support for participant on nutrition & aerobic/resistive exercise along with prescribed medications to achieve LDL 70mg , HDL >40mg .    Expected Outcomes Short  Term: Participant states understanding of desired cholesterol values and is compliant with medications prescribed. Participant is following exercise prescription and nutrition guidelines.;Long Term: Cholesterol controlled with medications as prescribed, with individualized exercise RX and with personalized nutrition plan. Value goals: LDL < 70mg , HDL > 40 mg.          Education:Diabetes - Individual verbal and written instruction to review signs/symptoms of diabetes, desired ranges of glucose level fasting, after meals and with exercise. Acknowledge that pre and post exercise glucose checks will be done for 3 sessions at entry of program.   Core Components/Risk Factors/Patient Goals Review:    Core Components/Risk Factors/Patient Goals at Discharge (Final Review):    ITP Comments:  ITP Comments     Row Name 01/07/24 1012 01/13/24 1519 01/27/24 1547 02/03/24 0758 03/02/24 0957   ITP Comments Initial phone call completed. Diagnosis can be found in [facility] 7/21. EP Orientation scheduled for Wednesday 8/6 at 1:45. Completed and gym orientation for cardiac rehab. Initial ITP created and sent for review to Dr. Oneil Pinal, Medical Director. First full day of exercise!  Patient was oriented to gym and equipment including functions, settings, policies, and procedures.  Patient's individual exercise prescription and treatment plan were reviewed.  All starting workloads were established based on the results of the 6 minute walk test done at initial orientation visit.  The plan for exercise progression was also introduced and progression will be customized based on patient's performance and goals. 30 Day review completed. Medical Director ITP review done; changes made as directed and signed approval by Medical Director. New to program. 30 Day review completed. Medical Director ITP review done, changes made as directed, and signed approval by Medical Director.    Row Name 03/23/24 0924           ITP  Comments Early discharge due to lack of attendance          Comments: Early discharge

## 2024-03-24 ENCOUNTER — Ambulatory Visit

## 2024-03-28 ENCOUNTER — Ambulatory Visit

## 2024-03-28 ENCOUNTER — Encounter

## 2024-03-30 ENCOUNTER — Ambulatory Visit

## 2024-03-30 ENCOUNTER — Encounter

## 2024-03-31 ENCOUNTER — Ambulatory Visit

## 2024-04-04 ENCOUNTER — Ambulatory Visit

## 2024-04-04 ENCOUNTER — Encounter

## 2024-04-06 ENCOUNTER — Ambulatory Visit

## 2024-04-06 ENCOUNTER — Encounter

## 2024-04-07 ENCOUNTER — Ambulatory Visit

## 2024-04-11 ENCOUNTER — Encounter

## 2024-04-11 ENCOUNTER — Ambulatory Visit

## 2024-04-13 ENCOUNTER — Encounter

## 2024-04-13 ENCOUNTER — Ambulatory Visit

## 2024-04-14 ENCOUNTER — Ambulatory Visit

## 2024-04-18 ENCOUNTER — Ambulatory Visit

## 2024-04-18 ENCOUNTER — Encounter

## 2024-04-20 ENCOUNTER — Ambulatory Visit

## 2024-04-20 ENCOUNTER — Encounter

## 2024-04-21 ENCOUNTER — Ambulatory Visit

## 2024-04-25 ENCOUNTER — Ambulatory Visit

## 2024-04-25 ENCOUNTER — Encounter

## 2024-04-27 ENCOUNTER — Ambulatory Visit

## 2024-04-27 ENCOUNTER — Encounter

## 2024-04-28 ENCOUNTER — Ambulatory Visit

## 2024-05-02 ENCOUNTER — Encounter

## 2024-05-02 ENCOUNTER — Ambulatory Visit

## 2024-05-04 ENCOUNTER — Ambulatory Visit

## 2024-05-04 ENCOUNTER — Encounter

## 2024-05-09 ENCOUNTER — Ambulatory Visit

## 2024-05-09 ENCOUNTER — Encounter

## 2024-05-11 ENCOUNTER — Encounter

## 2024-05-11 ENCOUNTER — Ambulatory Visit

## 2024-05-12 ENCOUNTER — Ambulatory Visit

## 2024-05-16 ENCOUNTER — Ambulatory Visit

## 2024-05-18 ENCOUNTER — Ambulatory Visit

## 2024-05-19 ENCOUNTER — Ambulatory Visit
